# Patient Record
Sex: Male | Born: 1935 | Race: White | Hispanic: No | Marital: Married | Smoking: Former smoker
Health system: Southern US, Community
[De-identification: ages and names within clinical notes are randomized; demographics above are authoritative.]

## PROBLEM LIST (undated history)

## (undated) DIAGNOSIS — I10 Essential (primary) hypertension: Secondary | ICD-10-CM

## (undated) DIAGNOSIS — Z950 Presence of cardiac pacemaker: Secondary | ICD-10-CM

## (undated) DIAGNOSIS — K219 Gastro-esophageal reflux disease without esophagitis: Secondary | ICD-10-CM

## (undated) DIAGNOSIS — I471 Supraventricular tachycardia: Secondary | ICD-10-CM

## (undated) DIAGNOSIS — N189 Chronic kidney disease, unspecified: Secondary | ICD-10-CM

## (undated) DIAGNOSIS — I495 Sick sinus syndrome: Secondary | ICD-10-CM

## (undated) DIAGNOSIS — K5730 Diverticulosis of large intestine without perforation or abscess without bleeding: Secondary | ICD-10-CM

## (undated) DIAGNOSIS — F419 Anxiety disorder, unspecified: Secondary | ICD-10-CM

## (undated) DIAGNOSIS — G894 Chronic pain syndrome: Secondary | ICD-10-CM

## (undated) DIAGNOSIS — N186 End stage renal disease: Secondary | ICD-10-CM

## (undated) DIAGNOSIS — E782 Mixed hyperlipidemia: Secondary | ICD-10-CM

## (undated) DIAGNOSIS — Z5189 Encounter for other specified aftercare: Secondary | ICD-10-CM

## (undated) DIAGNOSIS — T82598A Other mechanical complication of other cardiac and vascular devices and implants, initial encounter: Secondary | ICD-10-CM

## (undated) DIAGNOSIS — N2581 Secondary hyperparathyroidism of renal origin: Secondary | ICD-10-CM

## (undated) DIAGNOSIS — N185 Chronic kidney disease, stage 5: Secondary | ICD-10-CM

## (undated) DIAGNOSIS — J189 Pneumonia, unspecified organism: Secondary | ICD-10-CM

## (undated) DIAGNOSIS — D649 Anemia, unspecified: Secondary | ICD-10-CM

## (undated) DIAGNOSIS — M109 Gout, unspecified: Secondary | ICD-10-CM

## (undated) DIAGNOSIS — K5792 Diverticulitis of intestine, part unspecified, without perforation or abscess without bleeding: Secondary | ICD-10-CM

## (undated) HISTORY — DX: End stage renal disease: N18.6

## (undated) HISTORY — DX: Diverticulosis of large intestine without perforation or abscess without bleeding: K57.30

## (undated) HISTORY — DX: Anemia, unspecified: D64.9

## (undated) HISTORY — DX: Secondary hyperparathyroidism of renal origin: N25.81

## (undated) HISTORY — DX: Chronic kidney disease, unspecified: N18.9

## (undated) HISTORY — DX: Gout, unspecified: M10.9

## (undated) HISTORY — DX: Mixed hyperlipidemia: E78.2

## (undated) HISTORY — DX: Presence of cardiac pacemaker: Z95.0

## (undated) HISTORY — DX: Chronic pain syndrome: G89.4

## (undated) HISTORY — PX: PROSTATE SURGERY: SHX751

## (undated) HISTORY — DX: Essential (primary) hypertension: I10

## (undated) HISTORY — DX: Chronic kidney disease, stage 5: N18.5

## (undated) HISTORY — PX: HERNIA REPAIR: SHX51

## (undated) HISTORY — PX: BUNIONECTOMY: SHX129

## (undated) HISTORY — DX: Sick sinus syndrome: I49.5

## (undated) HISTORY — DX: Supraventricular tachycardia: I47.1

## (undated) HISTORY — DX: Other mechanical complication of other cardiac and vascular devices and implants, initial encounter: T82.598A

---

## 2010-06-05 DIAGNOSIS — K5792 Diverticulitis of intestine, part unspecified, without perforation or abscess without bleeding: Secondary | ICD-10-CM

## 2010-06-05 DIAGNOSIS — Z5189 Encounter for other specified aftercare: Secondary | ICD-10-CM

## 2010-06-05 DIAGNOSIS — IMO0001 Reserved for inherently not codable concepts without codable children: Secondary | ICD-10-CM

## 2010-06-05 HISTORY — DX: Diverticulitis of intestine, part unspecified, without perforation or abscess without bleeding: K57.92

## 2010-06-05 HISTORY — DX: Reserved for inherently not codable concepts without codable children: IMO0001

## 2010-06-05 HISTORY — DX: Encounter for other specified aftercare: Z51.89

## 2010-07-06 HISTORY — PX: INSERT / REPLACE / REMOVE PACEMAKER: SUR710

## 2010-07-12 DIAGNOSIS — Z950 Presence of cardiac pacemaker: Secondary | ICD-10-CM

## 2010-07-12 HISTORY — DX: Presence of cardiac pacemaker: Z95.0

## 2010-09-04 DIAGNOSIS — J189 Pneumonia, unspecified organism: Secondary | ICD-10-CM

## 2010-09-04 HISTORY — DX: Pneumonia, unspecified organism: J18.9

## 2011-07-10 ENCOUNTER — Encounter: Payer: Self-pay | Admitting: Vascular Surgery

## 2011-07-12 ENCOUNTER — Encounter: Payer: Self-pay | Admitting: Vascular Surgery

## 2011-07-13 ENCOUNTER — Encounter (INDEPENDENT_AMBULATORY_CARE_PROVIDER_SITE_OTHER): Payer: Medicare Other | Admitting: *Deleted

## 2011-07-13 ENCOUNTER — Ambulatory Visit (INDEPENDENT_AMBULATORY_CARE_PROVIDER_SITE_OTHER): Payer: Medicare Other | Admitting: Vascular Surgery

## 2011-07-13 ENCOUNTER — Encounter: Payer: Self-pay | Admitting: Vascular Surgery

## 2011-07-13 VITALS — BP 137/84 | HR 83 | Resp 16 | Ht 69.0 in | Wt 173.0 lb

## 2011-07-13 DIAGNOSIS — N186 End stage renal disease: Secondary | ICD-10-CM

## 2011-07-13 DIAGNOSIS — Z48812 Encounter for surgical aftercare following surgery on the circulatory system: Secondary | ICD-10-CM

## 2011-07-13 DIAGNOSIS — N184 Chronic kidney disease, stage 4 (severe): Secondary | ICD-10-CM

## 2011-07-13 HISTORY — DX: End stage renal disease: N18.6

## 2011-07-13 NOTE — Progress Notes (Signed)
[facility] [place] HISTORY AND PHYSICAL   History of Present Illness:  Patient is a 78 y.o. year old male who presents for placement of a permanent hemodialysis access. The patient is right handed .  The patient is not currently on hemodialysis.  The cause of renal failure is thought to be secondary to nephrolithiasis.  Other chronic medical problems include cardiac arrhythmia, secondary hyperparathyroidism, anemia. All of these are currently stable.  Past Medical History  Diagnosis Date  . Anemia   . Secondary hyperparathyroidism   . Chronic kidney disease   . Pacemaker 07/12/10    History reviewed. No pertinent past surgical history.   Social History History  Substance Use Topics  . Smoking status: Former Research scientist (life sciences)  . Smokeless tobacco: Not on file  . Alcohol Use: No    Family History Family History  Problem Relation Age of Onset  . Heart disease Mother   . Hypertension Father   . Hypertension Daughter   . Hypertension Son     Allergies  No Known Allergies   Current Outpatient Prescriptions  Medication Sig Dispense Refill  . aspirin 325 MG tablet Take 325 mg by mouth daily.      . calcitRIOL (ROCALTROL) 0.5 MCG capsule Take 0.5 mcg by mouth every other day.      . furosemide (LASIX) 20 MG tablet Take 20 mg by mouth 2 (two) times daily.      Marland Kitchen LORazepam (ATIVAN) 1 MG tablet Take 1 mg by mouth every 6 (six) hours as needed.      . pantoprazole (PROTONIX) 40 MG tablet Take 40 mg by mouth daily.      . sodium bicarbonate 650 MG tablet Take 650 mg by mouth 2 (two) times daily.      . Cyanocobalamin (VITAMIN B-12 CR PO) Take by mouth daily.      . traMADol (ULTRAM) 50 MG tablet Take 50 mg by mouth every 6 (six) hours as needed.      . vitamin E 400 UNIT capsule Take 400 Units by mouth daily.        ROS:   General:  No weight loss, Fever, chills  HEENT: No recent headaches, no nasal bleeding, no visual changes, no sore throat  Neurologic: No  dizziness, blackouts, seizures. No recent symptoms of stroke or mini- stroke. No recent episodes of slurred speech, or temporary blindness.  Cardiac: No recent episodes of chest pain/pressure, no shortness of breath at rest.  No shortness of breath with exertion.  Denies history of atrial fibrillation or irregular heartbeat  Vascular: No history of rest pain in feet.  No history of claudication.  No history of non-healing ulcer, No history of DVT   Pulmonary: No home oxygen, no productive cough, no hemoptysis,  No asthma or wheezing  Musculoskeletal:  [ ]  Arthritis, [ ]  Low back pain,  [ ]  Joint pain  Hematologic:No history of hypercoagulable state.  No history of easy bleeding.  No history of anemia  Gastrointestinal: No hematochezia or melena,  No gastroesophageal reflux, no trouble swallowing  Urinary: [ ]  chronic Kidney disease, [ ]  on HD - [ ]  MWF or [ ]  TTHS, [ ]  Burning with urination, [ ]  Frequent urination, [ ]  Difficulty urinating;   Skin: No rashes  Psychological: No history of anxiety,  No history of depression   Physical Examination  Filed Vitals:   07/13/11 1145  BP: 137/84  Pulse: 83  Resp: 16  Height: 5\' 9"  (1.753  m)  Weight: 173 lb (78.472 kg)  SpO2: 98%    Body mass index is 25.55 kg/(m^2).  General:  Alert and oriented, no acute distress HEENT: Normal Neck: No bruit or JVD Pulmonary: Clear to auscultation bilaterally Cardiac: Regular Rate and Rhythm without murmur, left side pacemaker Gastrointestinal: Soft, non-tender, non-distended, no mass, no scars Skin: No rash Extremity Pulses:  2+ radial, brachial pulses bilaterally, prominent cephalic vein from the wrist all the way to the upper arm in the right upper extremity Musculoskeletal: No deformity or edema  Neurologic: Upper and lower extremity motor 5/5 and symmetric  DATA: Vein mapping ultrasound today shows the cephalic vein in the right side between 30-40 mm in the forearm and 50-60 mm in the  upper arm the basilic vein is small in the right arm the basilic vein is small in the left arm cephalic vein is small in the left arm  ASSESSMENT:   Needs long-term hemodialysis access. His anatomy is suitable for placement of a right radiocephalic AV fistula.   PLAN:  Risks benefits possible complications and procedure details of placing a AV fistula were described to the patient and his family today. Risks and benefits including but limited to bleeding infection non-maturation of the fistula ischemic steal. They wish to proceed his fistula is scheduled for Monday, 07/17/2011.  Ruta Hinds, MD [facility] [place] Office: [phone] Pager: [phone]

## 2011-07-14 ENCOUNTER — Encounter (HOSPITAL_COMMUNITY): Payer: Self-pay | Admitting: Pharmacy Technician

## 2011-07-14 ENCOUNTER — Encounter (HOSPITAL_COMMUNITY): Payer: Self-pay | Admitting: *Deleted

## 2011-07-14 ENCOUNTER — Encounter: Payer: Self-pay | Admitting: Vascular Surgery

## 2011-07-14 ENCOUNTER — Other Ambulatory Visit: Payer: Self-pay | Admitting: *Deleted

## 2011-07-14 NOTE — Consult Note (Signed)
Anesthesia:  Patient is a 78 year old male scheduled for a right UE AVF on 2//11/13.  He will be a same day work-up.  He is not yet on HD.  History includes CKD, former smoker, anemia, secondary hyperparathyroidism, GERD, anxiety, and history includes tachybrady syndrome s/p dual chamber PPM ([employer]) on 07/12/10 by Dr. Agustin Cree [facility]).   Short Stay learned of this posting around 1130 today.  His Cardiologist's phone lines were busy, but we were able to send a request for records and completion of the cardiac peri-operative device form via fax.  Unfortunately, that office closes at 1230 and no records were received.  We were, however, able to receive records from [facility] including a recent EKG, CXR, echo, and PPM Op note.    Echo on 06/29/10 showed Preserved LV EF at 55%, trace pulmonary insufficiency, trace MR, mild TR, mild aortic insufficiency.  Relaxation abnormalities.    EKG from 09/14/10 showed a-paced rhythm.  CXR from that same day showed no acute abnormality.  He will be for labs on the day of surgery.  He will be evaluated by his Anesthesiologist on the day of surgery, but anticipate he can proceed if labs reasonable and no acute CV symptoms.

## 2011-07-14 NOTE — Procedures (Unsigned)
CEPHALIC VEIN MAPPING  INDICATION:  Preoperative vein mapping for AVF placement.  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  Pacemaker placement.  EXAM:  The right cephalic vein is compressible with diameter measurements ranging from 0.61 to 0.31 cm.  The right basilic vein is compressible with diameter measurements ranging from 0.32 to 0.15 cm.  The left cephalic vein is compressible with diameter measurements ranging from 0.31 to 0.15 cm.  The left basilic vein is compressible with diameter measurements ranging from 0.46 to 0.24 cm.  IMPRESSION:  Patent right and left cephalic and basilic veins with diameter measurements as described above.  ___________________________________________ Jessy Oto. Fields, MD  EM/MEDQ  D:  07/13/2011  T:  07/13/2011  Job:  [id number]

## 2011-07-16 MED ORDER — DEXTROSE 5 % IV SOLN
1.5000 g | INTRAVENOUS | Status: AC
  Administered 2011-07-17: 1.5 g via INTRAVENOUS
  Filled 2011-07-16: qty 1.5

## 2011-07-17 ENCOUNTER — Encounter (HOSPITAL_COMMUNITY)
Admission: RE | Disposition: A | Discharge status: Home / Self Care | Payer: Self-pay | Source: Ambulatory Visit | Attending: Vascular Surgery

## 2011-07-17 ENCOUNTER — Ambulatory Visit (HOSPITAL_COMMUNITY)
Admission: RE | Admit: 2011-07-17 | Discharge: 2011-07-17 | Disposition: A | Discharge status: Home / Self Care | Payer: Medicare Other | Source: Ambulatory Visit | Attending: Vascular Surgery | Admitting: Vascular Surgery

## 2011-07-17 ENCOUNTER — Ambulatory Visit (HOSPITAL_COMMUNITY): Payer: Medicare Other | Admitting: Vascular Surgery

## 2011-07-17 ENCOUNTER — Encounter (HOSPITAL_COMMUNITY): Payer: Self-pay | Admitting: *Deleted

## 2011-07-17 ENCOUNTER — Encounter (HOSPITAL_COMMUNITY): Payer: Self-pay | Admitting: Vascular Surgery

## 2011-07-17 DIAGNOSIS — Z950 Presence of cardiac pacemaker: Secondary | ICD-10-CM | POA: Insufficient documentation

## 2011-07-17 DIAGNOSIS — N186 End stage renal disease: Secondary | ICD-10-CM

## 2011-07-17 HISTORY — DX: Anxiety disorder, unspecified: F41.9

## 2011-07-17 HISTORY — PX: AV FISTULA PLACEMENT: SHX1204

## 2011-07-17 HISTORY — DX: Gastro-esophageal reflux disease without esophagitis: K21.9

## 2011-07-17 LAB — SURGICAL PCR SCREEN
MRSA, PCR: NEGATIVE
Staphylococcus aureus: NEGATIVE

## 2011-07-17 SURGERY — ARTERIOVENOUS (AV) FISTULA CREATION
Anesthesia: Monitor Anesthesia Care | Site: Arm Lower | Laterality: Right | Wound class: Clean

## 2011-07-17 MED ORDER — MIDAZOLAM HCL 5 MG/5ML IJ SOLN
INTRAMUSCULAR | Status: DC | PRN
  Administered 2011-07-17: 1 mg via INTRAVENOUS

## 2011-07-17 MED ORDER — HYDROMORPHONE HCL PF 1 MG/ML IJ SOLN: 0.2500 mg | INTRAMUSCULAR | Status: DC | PRN

## 2011-07-17 MED ORDER — SODIUM CHLORIDE 0.9 % IV SOLN: INTRAVENOUS | Status: DC

## 2011-07-17 MED ORDER — HEPARIN SODIUM (PORCINE) 1000 UNIT/ML IJ SOLN
INTRAMUSCULAR | Status: DC | PRN
  Administered 2011-07-17: 5000 [IU] via INTRAVENOUS

## 2011-07-17 MED ORDER — MUPIROCIN 2 % EX OINT
TOPICAL_OINTMENT | CUTANEOUS | Status: AC
  Filled 2011-07-17: qty 22

## 2011-07-17 MED ORDER — SODIUM CHLORIDE 0.9 % IV SOLN
INTRAVENOUS | Status: DC | PRN
  Administered 2011-07-17: 07:00:00 via INTRAVENOUS

## 2011-07-17 MED ORDER — LIDOCAINE HCL (PF) 1 % IJ SOLN
INTRAMUSCULAR | Status: DC | PRN
  Administered 2011-07-17: 8 mL

## 2011-07-17 MED ORDER — MUPIROCIN 2 % EX OINT
TOPICAL_OINTMENT | Freq: Once | CUTANEOUS | Status: DC
Start: 2011-07-17 — End: 2011-07-17

## 2011-07-17 MED ORDER — PROMETHAZINE HCL 25 MG/ML IJ SOLN: 6.2500 mg | INTRAMUSCULAR | Status: DC | PRN

## 2011-07-17 MED ORDER — FENTANYL CITRATE 0.05 MG/ML IJ SOLN
INTRAMUSCULAR | Status: DC | PRN
  Administered 2011-07-17: 50 ug via INTRAVENOUS

## 2011-07-17 MED ORDER — PROTAMINE SULFATE 10 MG/ML IV SOLN
INTRAVENOUS | Status: DC | PRN
  Administered 2011-07-17: 50 mg via INTRAVENOUS

## 2011-07-17 MED ORDER — SODIUM CHLORIDE 0.9 % IR SOLN
Status: DC | PRN
  Administered 2011-07-17: 09:00:00

## 2011-07-17 MED ORDER — PHENYLEPHRINE HCL 10 MG/ML IJ SOLN
INTRAMUSCULAR | Status: DC | PRN
  Administered 2011-07-17: 80 ug via INTRAVENOUS

## 2011-07-17 MED ORDER — MORPHINE SULFATE 4 MG/ML IJ SOLN: 0.0500 mg/kg | INTRAMUSCULAR | Status: DC | PRN

## 2011-07-17 MED ORDER — 0.9 % SODIUM CHLORIDE (POUR BTL) OPTIME
TOPICAL | Status: DC | PRN
  Administered 2011-07-17: 200 mL

## 2011-07-17 MED ORDER — MEPERIDINE HCL 25 MG/ML IJ SOLN: 6.2500 mg | INTRAMUSCULAR | Status: DC | PRN

## 2011-07-17 MED ORDER — PROPOFOL 10 MG/ML IV EMUL
INTRAVENOUS | Status: DC | PRN
  Administered 2011-07-17: 50 ug/kg/min via INTRAVENOUS

## 2011-07-17 MED ORDER — OXYCODONE HCL 5 MG PO TABS: 5.0000 mg | ORAL_TABLET | ORAL | Status: AC | PRN

## 2011-07-17 SURGICAL SUPPLY — 38 items
CANISTER SUCTION 2500CC (MISCELLANEOUS) ×2 IMPLANT
CLIP TI MEDIUM 6 (CLIP) ×2 IMPLANT
CLIP TI WIDE RED SMALL 6 (CLIP) ×2 IMPLANT
CLOTH BEACON ORANGE TIMEOUT ST (SAFETY) ×2 IMPLANT
COVER PROBE W GEL 5X96 (DRAPES) ×2 IMPLANT
COVER SURGICAL LIGHT HANDLE (MISCELLANEOUS) ×4 IMPLANT
DECANTER SPIKE VIAL GLASS SM (MISCELLANEOUS) ×2 IMPLANT
DERMABOND ADHESIVE PROPEN (GAUZE/BANDAGES/DRESSINGS) ×1
DERMABOND ADVANCED (GAUZE/BANDAGES/DRESSINGS) ×1
DERMABOND ADVANCED .7 DNX12 (GAUZE/BANDAGES/DRESSINGS) ×1 IMPLANT
DERMABOND ADVANCED .7 DNX6 (GAUZE/BANDAGES/DRESSINGS) ×1 IMPLANT
DRAIN PENROSE 1/4X12 LTX STRL (WOUND CARE) ×2 IMPLANT
ELECT REM PT RETURN 9FT ADLT (ELECTROSURGICAL) ×2
ELECTRODE REM PT RTRN 9FT ADLT (ELECTROSURGICAL) ×1 IMPLANT
GAUZE SPONGE 2X2 8PLY STRL LF (GAUZE/BANDAGES/DRESSINGS) ×1 IMPLANT
GEL ULTRASOUND 20GR AQUASONIC (MISCELLANEOUS) IMPLANT
GLOVE BIO SURGEON STRL SZ 6.5 (GLOVE) ×2 IMPLANT
GLOVE BIO SURGEON STRL SZ7.5 (GLOVE) ×2 IMPLANT
GLOVE BIOGEL PI IND STRL 7.0 (GLOVE) ×1 IMPLANT
GLOVE BIOGEL PI INDICATOR 7.0 (GLOVE) ×1
GOWN PREVENTION PLUS XLARGE (GOWN DISPOSABLE) ×2 IMPLANT
GOWN STRL NON-REIN LRG LVL3 (GOWN DISPOSABLE) ×4 IMPLANT
KIT BASIN OR (CUSTOM PROCEDURE TRAY) ×2 IMPLANT
KIT ROOM TURNOVER OR (KITS) ×2 IMPLANT
LOOP VESSEL MINI RED (MISCELLANEOUS) ×2 IMPLANT
NS IRRIG 1000ML POUR BTL (IV SOLUTION) ×2 IMPLANT
PACK CV ACCESS (CUSTOM PROCEDURE TRAY) ×2 IMPLANT
PAD ARMBOARD 7.5X6 YLW CONV (MISCELLANEOUS) ×4 IMPLANT
SPONGE GAUZE 2X2 STER 10/PKG (GAUZE/BANDAGES/DRESSINGS) ×1
SPONGE SURGIFOAM ABS GEL 100 (HEMOSTASIS) IMPLANT
SUT PROLENE 7 0 BV 1 (SUTURE) ×2 IMPLANT
SUT VIC AB 3-0 SH 27 (SUTURE) ×1
SUT VIC AB 3-0 SH 27X BRD (SUTURE) ×1 IMPLANT
SUT VICRYL 4-0 PS2 18IN ABS (SUTURE) ×2 IMPLANT
TOWEL OR 17X24 6PK STRL BLUE (TOWEL DISPOSABLE) ×2 IMPLANT
TOWEL OR 17X26 10 PK STRL BLUE (TOWEL DISPOSABLE) ×2 IMPLANT
UNDERPAD 30X30 INCONTINENT (UNDERPADS AND DIAPERS) ×2 IMPLANT
WATER STERILE IRR 1000ML POUR (IV SOLUTION) ×2 IMPLANT

## 2011-07-17 NOTE — Transfer of Care (Signed)
Immediate Anesthesia Transfer of Care Note  Patient: Timothy Carney  Procedure(s) Performed:  ARTERIOVENOUS (AV) FISTULA CREATION  Patient Location: PACU  Anesthesia Type: MAC  Level of Consciousness: awake, alert  and oriented  Airway & Oxygen Therapy: Patient Spontanous Breathing and Patient connected to nasal cannula oxygen  Post-op Assessment: Report given to PACU RN and Post -op Vital signs reviewed and stable  Post vital signs: Reviewed  Complications: No apparent anesthesia complications

## 2011-07-17 NOTE — Preoperative (Signed)
Beta Blockers   Reason not to administer Beta Blockers:Not Applicable. No home beta blockers 

## 2011-07-17 NOTE — Interval H&P Note (Signed)
History and Physical Interval Note:  07/17/2011 7:37 AM  Timothy Carney  has presented today for surgery, with the diagnosis of ESRD  The various methods of treatment have been discussed with the patient and family. After consideration of risks, benefits and other options for treatment, the patient has consented to  Procedure(s): ARTERIOVENOUS (AV) FISTULA CREATION as a surgical intervention .  The patients' history has been reviewed, patient examined, no change in status, stable for surgery.  I have reviewed the patients' chart and labs.  Questions were answered to the patient's satisfaction.     Ondrea Dow E

## 2011-07-17 NOTE — H&P (View-Only) (Signed)
[facility] [place] HISTORY AND PHYSICAL   History of Present Illness:  Patient is a 78 y.o. year old male who presents for placement of a permanent hemodialysis access. The patient is right handed .  The patient is not currently on hemodialysis.  The cause of renal failure is thought to be secondary to nephrolithiasis.  Other chronic medical problems include cardiac arrhythmia, secondary hyperparathyroidism, anemia. All of these are currently stable.  Past Medical History  Diagnosis Date  . Anemia   . Secondary hyperparathyroidism   . Chronic kidney disease   . Pacemaker 07/12/10    History reviewed. No pertinent past surgical history.   Social History History  Substance Use Topics  . Smoking status: Former Research scientist (life sciences)  . Smokeless tobacco: Not on file  . Alcohol Use: No    Family History Family History  Problem Relation Age of Onset  . Heart disease Mother   . Hypertension Father   . Hypertension Daughter   . Hypertension Son     Allergies  No Known Allergies   Current Outpatient Prescriptions  Medication Sig Dispense Refill  . aspirin 325 MG tablet Take 325 mg by mouth daily.      . calcitRIOL (ROCALTROL) 0.5 MCG capsule Take 0.5 mcg by mouth every other day.      . furosemide (LASIX) 20 MG tablet Take 20 mg by mouth 2 (two) times daily.      Marland Kitchen LORazepam (ATIVAN) 1 MG tablet Take 1 mg by mouth every 6 (six) hours as needed.      . pantoprazole (PROTONIX) 40 MG tablet Take 40 mg by mouth daily.      . sodium bicarbonate 650 MG tablet Take 650 mg by mouth 2 (two) times daily.      . Cyanocobalamin (VITAMIN B-12 CR PO) Take by mouth daily.      . traMADol (ULTRAM) 50 MG tablet Take 50 mg by mouth every 6 (six) hours as needed.      . vitamin E 400 UNIT capsule Take 400 Units by mouth daily.        ROS:   General:  No weight loss, Fever, chills  HEENT: No recent headaches, no nasal bleeding, no visual changes, no sore throat  Neurologic: No  dizziness, blackouts, seizures. No recent symptoms of stroke or mini- stroke. No recent episodes of slurred speech, or temporary blindness.  Cardiac: No recent episodes of chest pain/pressure, no shortness of breath at rest.  No shortness of breath with exertion.  Denies history of atrial fibrillation or irregular heartbeat  Vascular: No history of rest pain in feet.  No history of claudication.  No history of non-healing ulcer, No history of DVT   Pulmonary: No home oxygen, no productive cough, no hemoptysis,  No asthma or wheezing  Musculoskeletal:  [ ]  Arthritis, [ ]  Low back pain,  [ ]  Joint pain  Hematologic:No history of hypercoagulable state.  No history of easy bleeding.  No history of anemia  Gastrointestinal: No hematochezia or melena,  No gastroesophageal reflux, no trouble swallowing  Urinary: [ ]  chronic Kidney disease, [ ]  on HD - [ ]  MWF or [ ]  TTHS, [ ]  Burning with urination, [ ]  Frequent urination, [ ]  Difficulty urinating;   Skin: No rashes  Psychological: No history of anxiety,  No history of depression   Physical Examination  Filed Vitals:   07/13/11 1145  BP: 137/84  Pulse: 83  Resp: 16  Height: 5\' 9"  (1.753  m)  Weight: 173 lb (78.472 kg)  SpO2: 98%    Body mass index is 25.55 kg/(m^2).  General:  Alert and oriented, no acute distress HEENT: Normal Neck: No bruit or JVD Pulmonary: Clear to auscultation bilaterally Cardiac: Regular Rate and Rhythm without murmur, left side pacemaker Gastrointestinal: Soft, non-tender, non-distended, no mass, no scars Skin: No rash Extremity Pulses:  2+ radial, brachial pulses bilaterally, prominent cephalic vein from the wrist all the way to the upper arm in the right upper extremity Musculoskeletal: No deformity or edema  Neurologic: Upper and lower extremity motor 5/5 and symmetric  DATA: Vein mapping ultrasound today shows the cephalic vein in the right side between 30-40 mm in the forearm and 50-60 mm in the  upper arm the basilic vein is small in the right arm the basilic vein is small in the left arm cephalic vein is small in the left arm  ASSESSMENT:   Needs long-term hemodialysis access. His anatomy is suitable for placement of a right radiocephalic AV fistula.   PLAN:  Risks benefits possible complications and procedure details of placing a AV fistula were described to the patient and his family today. Risks and benefits including but limited to bleeding infection non-maturation of the fistula ischemic steal. They wish to proceed his fistula is scheduled for Monday, 07/17/2011.  Ruta Hinds, MD [facility] [place] Office: [phone] Pager: [phone]

## 2011-07-17 NOTE — Op Note (Signed)
Procedure: Right Radial Cephalic AV fistula  Preop: ESRD  Postop: ESRD  Anesthesia: MAC with local  Assistant:Regina Roczniak, PA-c  Findings: 3 mm cephalic vein      2.5  mm radial artery with some calcification   Procedure Details: The rightt upper extremity was prepped and draped in usual sterile fashion. Local anesthesia was infiltrated midway between the cephalic and radial artery anatomically.  The vein was not palpable due to the patients obesity.  A longitudinal skin incision was then made in this location at the distal right forearm.  The incision was carried into the subcutaneous tissues down to level cephalic vein. The vein had some spasm but was overall reasonable quality accepting a 3 mm dilator.  The vein was dissected free circumferentially and small side branches ligated and divided between silk ties. The distal end was ligated and the vein probed and found to accept up to a 3 mm dilator.  This was gently distended with heparinized saline, spatulated,  and marked for orientation.  Next the radial artery was dissected free in the medial portion incision. The artery was  2.5 mm in diameter with some calcification but a reasonable pulse. The vessel loops were placed proximal and distal to the planned site of arteriotomy. The patient was given 5000 units of intravenous heparin. After appropriate circulation time, the vessel loops were used to control the artery. A longitudinal opening was made in the rightt radial artery. The vein was controlled proximally with a fine bulldog clamp. The vein was then swung over to the artery and sewn end of vein to side of artery using a running 7-0 Prolene suture. Just prior to completion, the anastomosis was fore bled back bled and thoroughly flushed. The anastomosis was secured, vessel loops released, and there was a palpable thrill in the fistula immediately. The patient was given 50 mg of Protamine for heparin reversal.   After hemostasis was  obtained, the subcutaneous tissues were reapproximated using a running 3-0 Vicryl suture. The skin was then closed with a 4 Vicryl subcuticular stitch. Dermabond was applied to the skin incision.    Ruta Hinds, MD [facility] [place] Office: [phone] Pager: [phone]

## 2011-07-17 NOTE — Progress Notes (Signed)
[place] with Dr Ola Spurr this am regarding....dual chamber pacer......Marland Kitchenwe were unable to get pacer form sent to cardio....their office closed at 12:30 on Friday.....Marland Kitchensurgery is below the umbilicus..Dr Ola Spurr said it would  "ok"...

## 2011-07-17 NOTE — Anesthesia Preprocedure Evaluation (Addendum)
Anesthesia Evaluation  Patient identified by MRN, date of birth, ID band Patient awake    Reviewed: Allergy & Precautions, H&P , NPO status , Patient's Chart, lab work & pertinent test results, reviewed documented beta blocker date and time   History of Anesthesia Complications Negative for: history of anesthetic complications  Airway Mallampati: II      Dental  (+) Teeth Intact and Dental Advisory Given   Pulmonary neg pulmonary ROS,  clear to auscultation        Cardiovascular + pacemaker Regular Normal    Neuro/Psych Anxiety Negative Neurological ROS     GI/Hepatic Neg liver ROS, GERD-  Medicated and Controlled,  Endo/Other  Negative Endocrine ROS  Renal/GU CRFRenal disease  Genitourinary negative   Musculoskeletal   Abdominal   Peds  Hematology negative hematology ROS (+)   Anesthesia Other Findings   Reproductive/Obstetrics                          Anesthesia Physical Anesthesia Plan  ASA: III  Anesthesia Plan: MAC   Post-op Pain Management:    Induction:   Airway Management Planned: Nasal Cannula  Additional Equipment:   Intra-op Plan:   Post-operative Plan:   Informed Consent: I have reviewed the patients History and Physical, chart, labs and discussed the procedure including the risks, benefits and alternatives for the proposed anesthesia with the patient or authorized representative who has indicated his/her understanding and acceptance.   Dental advisory given  Plan Discussed with: Anesthesiologist  Anesthesia Plan Comments:         Anesthesia Quick Evaluation

## 2011-07-17 NOTE — Anesthesia Postprocedure Evaluation (Signed)
  Anesthesia Post-op Note  Patient: Timothy Carney  Procedure(s) Performed:  ARTERIOVENOUS (AV) FISTULA CREATION  Patient Location: PACU  Anesthesia Type: MAC  Level of Consciousness: awake  Airway and Oxygen Therapy: Patient Spontanous Breathing  Post-op Pain: mild  Post-op Assessment: Post-op Vital signs reviewed  Post-op Vital Signs: stable  Complications: No apparent anesthesia complications

## 2011-07-18 ENCOUNTER — Encounter (HOSPITAL_COMMUNITY): Payer: Self-pay | Admitting: Vascular Surgery

## 2011-07-18 MED FILL — Mupirocin Oint 2%: CUTANEOUS | Qty: 22 | Status: AC

## 2011-07-19 LAB — POCT I-STAT 4, (NA,K, GLUC, HGB,HCT)
Glucose, Bld: 112 mg/dL — ABNORMAL HIGH (ref 70–99)
HCT: 37 % — ABNORMAL LOW (ref 39.0–52.0)
Potassium: 3.6 mEq/L (ref 3.5–5.1)
Sodium: 142 mEq/L (ref 135–145)

## 2011-07-21 ENCOUNTER — Other Ambulatory Visit: Payer: Medicare Other

## 2011-07-21 ENCOUNTER — Ambulatory Visit: Payer: Medicare Other | Admitting: Vascular Surgery

## 2011-08-23 ENCOUNTER — Encounter: Payer: Self-pay | Admitting: Vascular Surgery

## 2011-08-24 ENCOUNTER — Encounter: Payer: Self-pay | Admitting: Vascular Surgery

## 2011-08-24 ENCOUNTER — Ambulatory Visit (INDEPENDENT_AMBULATORY_CARE_PROVIDER_SITE_OTHER): Payer: Medicare Other | Admitting: Vascular Surgery

## 2011-08-24 ENCOUNTER — Ambulatory Visit: Payer: Medicare Other | Admitting: Vascular Surgery

## 2011-08-24 VITALS — BP 150/76 | HR 78 | Temp 98.1°F | Ht 69.0 in | Wt 176.2 lb

## 2011-08-24 DIAGNOSIS — N186 End stage renal disease: Secondary | ICD-10-CM

## 2011-08-24 NOTE — Progress Notes (Signed)
Addended by: Mena Goes on: 08/24/2011 11:42 AM   Modules accepted: Orders

## 2011-08-24 NOTE — Progress Notes (Signed)
[facility] [place] HISTORY AND PHYSICAL    History of Present Illness:  Patient is a 78 y.o. year old male who presents for post-operative follow-up after placement of a right radiocephalic AV fistula. This was placed 07/17/2011. The patient is currently not on dialysis. He denies any numbness or tingling in the hand. He is starting to squeeze a ball to develop the fistula.   Physical Examination  Filed Vitals:   08/24/11 1015  BP: 150/76  Pulse: 78  Temp: 98.1 F (36.7 C)    Body mass index is 26.02 kg/(m^2).  General:  Alert and oriented, no acute distress Skin: No rash Extremities:  Healed incision right wrist, palpable cephalic vein with easily palpable thrill up to mid forearm the fistula is not as developed more distally Neurologic: Upper and lower extremity motor 5/5 and symmetric    ASSESSMENT: Developing fistula but not ready for cannulation at this point   PLAN:  He will followup in one month with a duplex of his AV fistula for depth in diameter. If the fistula has not fully developed by his next followup and we will consider revision or other intervention.   Ruta Hinds, MD [facility] [place] Office: [phone] Pager: [phone]

## 2011-09-19 ENCOUNTER — Encounter: Payer: Self-pay | Admitting: Vascular Surgery

## 2011-09-21 ENCOUNTER — Encounter: Payer: Self-pay | Admitting: Vascular Surgery

## 2011-09-21 ENCOUNTER — Encounter (INDEPENDENT_AMBULATORY_CARE_PROVIDER_SITE_OTHER): Payer: Medicare Other | Admitting: *Deleted

## 2011-09-21 ENCOUNTER — Ambulatory Visit (INDEPENDENT_AMBULATORY_CARE_PROVIDER_SITE_OTHER): Payer: Medicare Other | Admitting: Vascular Surgery

## 2011-09-21 VITALS — BP 143/89 | HR 75 | Temp 97.8°F | Ht 69.0 in | Wt 174.0 lb

## 2011-09-21 DIAGNOSIS — Z48812 Encounter for surgical aftercare following surgery on the circulatory system: Secondary | ICD-10-CM

## 2011-09-21 DIAGNOSIS — T82598A Other mechanical complication of other cardiac and vascular devices and implants, initial encounter: Secondary | ICD-10-CM

## 2011-09-21 DIAGNOSIS — N186 End stage renal disease: Secondary | ICD-10-CM

## 2011-09-21 DIAGNOSIS — N184 Chronic kidney disease, stage 4 (severe): Secondary | ICD-10-CM

## 2011-09-21 HISTORY — DX: Other mechanical complication of other cardiac and vascular devices and implants, initial encounter: T82.598A

## 2011-09-21 NOTE — Progress Notes (Signed)
Patient had a right radiocephalic AV fistula placed 07/17/2011. He returns today for further followup. He denies any symptoms of numbness or tingling in the hand. He is currently not on dialysis. However recent note from Dr. Moshe Cipro suggested that he may need this soon.  Physical exam: Filed Vitals:   09/21/11 1451  BP: 143/89  Pulse: 75  Temp: 97.8 F (36.6 C)  TempSrc: Oral  Height: 5\' 9"  (1.753 m)  Weight: 174 lb (78.926 kg)  SpO2: 99%    Right upper extremity: Well-healed wrist incision, fistula has a palpable thrill but seems to have diminished flow distally. Duplex ultrasound today shows narrowing of the proximal few centimeters of the fistula.  Assessment: Proximal narrowing of right radiocephalic AV fistula.  Plan: Will revise in the operating room on 10/04/2011.  Procedure risks benefits possible complications discussed the patient and his family today.  Ruta Hinds, MD [facility] [place] Office: [phone] Pager: [phone]

## 2011-09-22 ENCOUNTER — Other Ambulatory Visit: Payer: Self-pay

## 2011-09-22 ENCOUNTER — Encounter (HOSPITAL_COMMUNITY): Payer: Self-pay | Admitting: Pharmacy Technician

## 2011-09-28 ENCOUNTER — Encounter (HOSPITAL_COMMUNITY)
Admission: RE | Admit: 2011-09-28 | Discharge: 2011-09-28 | Disposition: A | Discharge status: Home / Self Care | Payer: Medicare Other | Source: Ambulatory Visit | Attending: Vascular Surgery | Admitting: Vascular Surgery

## 2011-09-28 ENCOUNTER — Encounter (HOSPITAL_COMMUNITY): Payer: Self-pay

## 2011-09-28 ENCOUNTER — Encounter (HOSPITAL_COMMUNITY)
Admission: RE | Admit: 2011-09-28 | Discharge: 2011-09-28 | Disposition: A | Discharge status: Home / Self Care | Payer: Medicare Other | Source: Ambulatory Visit | Attending: Anesthesiology | Admitting: Anesthesiology

## 2011-09-28 ENCOUNTER — Other Ambulatory Visit: Payer: Self-pay

## 2011-09-28 HISTORY — DX: Encounter for other specified aftercare: Z51.89

## 2011-09-28 HISTORY — DX: Diverticulitis of intestine, part unspecified, without perforation or abscess without bleeding: K57.92

## 2011-09-28 HISTORY — DX: Pneumonia, unspecified organism: J18.9

## 2011-09-28 HISTORY — DX: Essential (primary) hypertension: I10

## 2011-09-28 NOTE — Pre-Procedure Instructions (Signed)
[place]  09/28/2011   Your procedure is scheduled on:  Wednesday Oct 04, 2011.  Report to [facility] at 0730 AM.  Call this number if you have problems the morning of surgery: [phone]   Remember:   Do not eat food:After Midnight.  May have clear liquids: up to 4 Hours before arrival until 0330 am.  Clear liquids include soda, tea, black coffee, apple or grape juice, broth.  Take these medicines the morning of surgery with A SIP OF WATER: Lorazepam (Ativan), Metoprolol (Toprol XL), Pantoprazole (Protonix), and Tramadol (Ultram).   Do not wear jewelry, make-up or nail polish.  Do not wear lotions, powders, or perfumes. You may wear deodorant.  Do not shave 48 hours prior to surgery.  Do not bring valuables to the hospital.  Contacts, dentures or bridgework may not be worn into surgery.  Leave suitcase in the car. After surgery it may be brought to your room.  For patients admitted to the hospital, checkout time is 11:00 AM the day of discharge.   Patients discharged the day of surgery will not be allowed to drive home.  Name and phone number of your driver:   Special Instructions: CHG Shower Use Special Wash: 1/2 bottle night before surgery and 1/2 bottle morning of surgery.   Please read over the following fact sheets that you were given: Pain Booklet, Coughing and Deep Breathing, MRSA Information and Surgical Site Infection Prevention

## 2011-09-28 NOTE — Progress Notes (Signed)
Pt informed nurse that his right arm had a fistula and not left arm. Nurse called Dr. Oneida Alar office for clarification. Judy instructed nurse that pt was correct and that pts right arm was to be operated on on Oct 04, 2011 and not left as consent had stated. Consent changed. Plan of care updated with pt. Pt signed consent and consent was placed on chart.

## 2011-09-28 NOTE — Progress Notes (Signed)
Pt confirmed to having a stress test years ago but was unable to inform nurse as to where it occurred "somewhere in [facility]". Pt denied having a cardiac cath or sleep study.Records were requested from PCP at [facility] Dr. Garlon Hatchet at [phone], [facility] at [phone], and from [facility] Dr. Agustin Cree at [phone]. Perioperative prescription for implanted cardiac device programming form was also faxed to Dr. Wendy Poet office at [phone].

## 2011-09-29 NOTE — Procedures (Unsigned)
VASCULAR LAB EXAM  INDICATION:  Follow up right arm access placement.  HISTORY: Chronic kidney disease stage 4-5, right radiocephalic AV fistula placement 07/17/2011. Diabetes: Cardiac: Hypertension:  EXAM:  Patent right radiocephalic AV fistula with an area of narrowing and possibly thickened walls of the outflow vein near the anastomosis, with velocities of 711 cm/s.  IMPRESSION:  Patent right radiocephalic arteriovenous fistula with depth, diameter, branch, and velocity measurements shown on the following worksheet.  ___________________________________________ Jessy Oto. Fields, MD  EM/MEDQ  D:  09/22/2011  T:  09/22/2011  Job:  [id number]

## 2011-09-29 NOTE — Progress Notes (Signed)
Dr. Wendy Poet office called and they confirmed that they had received by fax the implanted cardiac device programming sheet. Nurse requested to receptionist that Physician fill out form and fax it back ASAP so that [employer] could be contacted before upcoming surgery. Receptionist verbalized understanding.

## 2011-10-03 MED ORDER — CEFAZOLIN SODIUM 1-5 GM-% IV SOLN
1.0000 g | Freq: Once | INTRAVENOUS | Status: AC
  Administered 2011-10-04: 1 g via INTRAVENOUS
  Filled 2011-10-03: qty 50

## 2011-10-03 NOTE — Progress Notes (Signed)
Dr. Agustin Cree called again and Nurse spoke with Timothy Carney and explained to her that the pacemaker sheet would need to be completed and faxed ASAP since pts surgery is scheduled for tomorrow. She verbalized understanding and stated "I passed it on to the physician and he signed off on it....Marland KitchenMarland KitchenI will let his nurse know about it."

## 2011-10-04 ENCOUNTER — Encounter (HOSPITAL_COMMUNITY): Payer: Self-pay

## 2011-10-04 ENCOUNTER — Encounter (HOSPITAL_COMMUNITY): Payer: Self-pay | Admitting: *Deleted

## 2011-10-04 ENCOUNTER — Ambulatory Visit (HOSPITAL_COMMUNITY)
Admission: RE | Admit: 2011-10-04 | Discharge: 2011-10-04 | Disposition: A | Discharge status: Home / Self Care | Payer: Medicare Other | Source: Ambulatory Visit | Attending: Vascular Surgery | Admitting: Vascular Surgery

## 2011-10-04 ENCOUNTER — Telehealth: Payer: Self-pay | Admitting: Vascular Surgery

## 2011-10-04 ENCOUNTER — Other Ambulatory Visit: Payer: Self-pay | Admitting: *Deleted

## 2011-10-04 ENCOUNTER — Ambulatory Visit (HOSPITAL_COMMUNITY): Payer: Medicare Other | Admitting: *Deleted

## 2011-10-04 ENCOUNTER — Encounter (HOSPITAL_COMMUNITY)
Admission: RE | Disposition: A | Discharge status: Home / Self Care | Payer: Self-pay | Source: Ambulatory Visit | Attending: Vascular Surgery

## 2011-10-04 DIAGNOSIS — N186 End stage renal disease: Secondary | ICD-10-CM | POA: Insufficient documentation

## 2011-10-04 DIAGNOSIS — Z48812 Encounter for surgical aftercare following surgery on the circulatory system: Secondary | ICD-10-CM

## 2011-10-04 DIAGNOSIS — T82598A Other mechanical complication of other cardiac and vascular devices and implants, initial encounter: Secondary | ICD-10-CM

## 2011-10-04 DIAGNOSIS — Z01812 Encounter for preprocedural laboratory examination: Secondary | ICD-10-CM | POA: Insufficient documentation

## 2011-10-04 DIAGNOSIS — T82898A Other specified complication of vascular prosthetic devices, implants and grafts, initial encounter: Secondary | ICD-10-CM

## 2011-10-04 HISTORY — PX: AV FISTULA PLACEMENT: SHX1204

## 2011-10-04 LAB — POCT I-STAT 4, (NA,K, GLUC, HGB,HCT)
Glucose, Bld: 107 mg/dL — ABNORMAL HIGH (ref 70–99)
HCT: 35 % — ABNORMAL LOW (ref 39.0–52.0)
Potassium: 4 mEq/L (ref 3.5–5.1)
Sodium: 141 mEq/L (ref 135–145)

## 2011-10-04 SURGERY — LIGATION OF ARTERIOVENOUS  FISTULA
Anesthesia: Monitor Anesthesia Care | Site: Wrist | Laterality: Right | Wound class: Clean

## 2011-10-04 MED ORDER — 0.9 % SODIUM CHLORIDE (POUR BTL) OPTIME
TOPICAL | Status: DC | PRN
  Administered 2011-10-04: 1000 mL

## 2011-10-04 MED ORDER — LIDOCAINE HCL (CARDIAC) 20 MG/ML IV SOLN
INTRAVENOUS | Status: DC | PRN
  Administered 2011-10-04: 40 mg via INTRAVENOUS

## 2011-10-04 MED ORDER — MIDAZOLAM HCL 5 MG/5ML IJ SOLN
INTRAMUSCULAR | Status: DC | PRN
  Administered 2011-10-04: 1 mg via INTRAVENOUS

## 2011-10-04 MED ORDER — FENTANYL CITRATE 0.05 MG/ML IJ SOLN
INTRAMUSCULAR | Status: DC | PRN
  Administered 2011-10-04 (×2): 25 ug via INTRAVENOUS

## 2011-10-04 MED ORDER — MORPHINE SULFATE 2 MG/ML IJ SOLN: 0.0500 mg/kg | INTRAMUSCULAR | Status: DC | PRN

## 2011-10-04 MED ORDER — SODIUM CHLORIDE 0.9 % IR SOLN
Status: DC | PRN
  Administered 2011-10-04: 11:00:00

## 2011-10-04 MED ORDER — HEPARIN SODIUM (PORCINE) 1000 UNIT/ML IJ SOLN
INTRAMUSCULAR | Status: DC | PRN
  Administered 2011-10-04: 5000 [IU] via INTRAVENOUS

## 2011-10-04 MED ORDER — ONDANSETRON HCL 4 MG/2ML IJ SOLN: 4.0000 mg | Freq: Once | INTRAMUSCULAR | Status: DC | PRN

## 2011-10-04 MED ORDER — LIDOCAINE HCL (PF) 1 % IJ SOLN
INTRAMUSCULAR | Status: DC | PRN
  Administered 2011-10-04: 30 mL

## 2011-10-04 MED ORDER — ONDANSETRON HCL 4 MG/2ML IJ SOLN
INTRAMUSCULAR | Status: DC | PRN
  Administered 2011-10-04: 4 mg via INTRAVENOUS

## 2011-10-04 MED ORDER — PROPOFOL 10 MG/ML IV EMUL
INTRAVENOUS | Status: DC | PRN
  Administered 2011-10-04: 100 ug/kg/min via INTRAVENOUS

## 2011-10-04 MED ORDER — SODIUM CHLORIDE 0.9 % IV SOLN
INTRAVENOUS | Status: DC
  Administered 2011-10-04: 20 mL/h via INTRAVENOUS

## 2011-10-04 MED ORDER — HYDROMORPHONE HCL PF 1 MG/ML IJ SOLN: 0.2500 mg | INTRAMUSCULAR | Status: DC | PRN

## 2011-10-04 MED ORDER — SODIUM CHLORIDE 0.9 % IV SOLN
INTRAVENOUS | Status: DC | PRN
  Administered 2011-10-04: 11:00:00 via INTRAVENOUS

## 2011-10-04 MED ORDER — METOPROLOL SUCCINATE ER 25 MG PO TB24
25.0000 mg | ORAL_TABLET | ORAL | Status: AC
  Administered 2011-10-04: 25 mg via ORAL
  Filled 2011-10-04: qty 1

## 2011-10-04 MED ORDER — TRAMADOL HCL 50 MG PO TABS: 50.0000 mg | ORAL_TABLET | Freq: Two times a day (BID) | ORAL | Status: DC | PRN

## 2011-10-04 SURGICAL SUPPLY — 43 items
CANISTER SUCTION 2500CC (MISCELLANEOUS) ×4 IMPLANT
CLIP TI MEDIUM 6 (CLIP) ×4 IMPLANT
CLIP TI WIDE RED SMALL 6 (CLIP) ×8 IMPLANT
CLOTH BEACON ORANGE TIMEOUT ST (SAFETY) ×4 IMPLANT
COVER PROBE W GEL 5X96 (DRAPES) ×4 IMPLANT
COVER SURGICAL LIGHT HANDLE (MISCELLANEOUS) ×8 IMPLANT
DECANTER SPIKE VIAL GLASS SM (MISCELLANEOUS) ×4 IMPLANT
DERMABOND ADHESIVE PROPEN (GAUZE/BANDAGES/DRESSINGS) ×1
DERMABOND ADVANCED (GAUZE/BANDAGES/DRESSINGS) ×1
DERMABOND ADVANCED .7 DNX12 (GAUZE/BANDAGES/DRESSINGS) ×3 IMPLANT
DERMABOND ADVANCED .7 DNX6 (GAUZE/BANDAGES/DRESSINGS) ×3 IMPLANT
DRAIN PENROSE 1/4X12 LTX STRL (WOUND CARE) ×4 IMPLANT
ELECT REM PT RETURN 9FT ADLT (ELECTROSURGICAL) ×4
ELECTRODE REM PT RTRN 9FT ADLT (ELECTROSURGICAL) ×3 IMPLANT
GAUZE SPONGE 2X2 8PLY STRL LF (GAUZE/BANDAGES/DRESSINGS) ×3 IMPLANT
GEL ULTRASOUND 20GR AQUASONIC (MISCELLANEOUS) IMPLANT
GLOVE BIO SURGEON STRL SZ 6.5 (GLOVE) ×8 IMPLANT
GLOVE BIO SURGEON STRL SZ7.5 (GLOVE) ×4 IMPLANT
GLOVE BIOGEL PI IND STRL 7.0 (GLOVE) ×6 IMPLANT
GLOVE BIOGEL PI INDICATOR 7.0 (GLOVE) ×2
GLOVE EXAM NITRILE XL STR (GLOVE) ×4 IMPLANT
GLOVE SURG SS PI 7.5 STRL IVOR (GLOVE) ×4 IMPLANT
GOWN PREVENTION PLUS XLARGE (GOWN DISPOSABLE) ×4 IMPLANT
GOWN STRL NON-REIN LRG LVL3 (GOWN DISPOSABLE) ×8 IMPLANT
GOWN STRL REIN XL XLG (GOWN DISPOSABLE) ×4 IMPLANT
KIT BASIN OR (CUSTOM PROCEDURE TRAY) ×4 IMPLANT
KIT ROOM TURNOVER OR (KITS) ×4 IMPLANT
LOOP VESSEL MINI RED (MISCELLANEOUS) IMPLANT
NS IRRIG 1000ML POUR BTL (IV SOLUTION) ×4 IMPLANT
PACK CV ACCESS (CUSTOM PROCEDURE TRAY) ×4 IMPLANT
PAD ARMBOARD 7.5X6 YLW CONV (MISCELLANEOUS) ×8 IMPLANT
SPONGE GAUZE 2X2 STER 10/PKG (GAUZE/BANDAGES/DRESSINGS) ×1
SPONGE SURGIFOAM ABS GEL 100 (HEMOSTASIS) IMPLANT
SUT PROLENE 7 0 BV 1 (SUTURE) ×4 IMPLANT
SUT SILK 3 0 (SUTURE) ×1
SUT SILK 3-0 18XBRD TIE 12 (SUTURE) ×3 IMPLANT
SUT VIC AB 3-0 SH 27 (SUTURE) ×2
SUT VIC AB 3-0 SH 27X BRD (SUTURE) ×6 IMPLANT
SUT VICRYL 4-0 PS2 18IN ABS (SUTURE) ×8 IMPLANT
TOWEL OR 17X24 6PK STRL BLUE (TOWEL DISPOSABLE) ×4 IMPLANT
TOWEL OR 17X26 10 PK STRL BLUE (TOWEL DISPOSABLE) ×4 IMPLANT
UNDERPAD 30X30 INCONTINENT (UNDERPADS AND DIAPERS) ×4 IMPLANT
WATER STERILE IRR 1000ML POUR (IV SOLUTION) ×4 IMPLANT

## 2011-10-04 NOTE — Preoperative (Signed)
Beta Blockers   Reason not to administer Beta Blockers:Not Applicable, took Toprol this AM

## 2011-10-04 NOTE — Op Note (Signed)
Procedure: 1.  Ligation of right radial cephalic AV fistula   2.  Left Brachial Cephalic AV fistula  Preop: ESRD  Postop: ESRD  Anesthesia: General  Assistant:Samantha Rhyne, PAC  Findings: 3.5 mm cephalic vein  Procedure: After obtaining informed consent, the patient was taken to the operating room. After adequate sedation, the right upper extremity was prepped and draped in usual sterile fashion. Local anesthesia was infiltrated near the right wrist through a preexisting scar.  A longitudinal incision was made through the scar and carried down through the subcutaneous tissues to the level of the existing radial cephalic AVF.  This was pulsatile.  It was dissected free circumferentially over several centimeters and down to the level of the arterial anastomosis.  The patient was given 5000 units of intravenous heparin.  After 2 minutes of circulation time the vein was clamped proximally and distally. An 11 blade was used to open the vein longitudinally.  There was diffuse intimal hyperplasia nearly obliterating the lumen of the vein extending over several centimeters.  I did not feel that a patch or revision would make the fistula usable. The vein was ligated just above the arterial anastomosis with a 3 0 silk tie and on the vein distally.  The subcutaneous tissues were closed with a running 3 0 Vicryl suture.  The skin was closed with a 4 0 Vicryl subcuticular stitch.  Local anesthesia was infiltrated near the antecubital crease.   A transverse incision was then made near the antecubital crease the right arm. The incision was carried into the subcutaneous tissues down to level of the cephalic vein. The cephalic vein was approximately 3.5 mm in diameter. It was of good quality. This was dissected free circumferentially and small side branches ligated and divided between silk ties or clips. Next the brachial artery was dissected free in the medial portion of the incision. The artery was  5 mm in  diameter. The vessel loops were placed proximal and distal to the planned site of arteriotomy. After appropriate circulation time, the vessel loops were used to control the artery. A longitudinal opening was made in the brachial artery.  The vein was ligated distally with a 2-0 silk tie. The vein was controlled proximally with a fine bulldog clamp. The vein was then swung over to the artery and sewn end of vein to side of artery using a running 7-0 Prolene suture. Just prior to completion of the anastomosis, everything was fore bled back bled and thoroughly flushed. The anastomosis was secured, vessel loops released, and there was a palpable thrill in the fistula immediately. After hemostasis was obtained, the subcutaneous tissues were reapproximated using a running 3-0 Vicryl suture. The skin was then closed with a 4 Vicryl subcuticular stitch. Dermabond was applied to the skin incision.    Ruta Hinds, MD [facility] [place] Office: [phone] Pager: [phone]

## 2011-10-04 NOTE — Anesthesia Preprocedure Evaluation (Signed)
Anesthesia Evaluation  Patient identified by MRN, date of birth, ID band Patient awake    Reviewed: Allergy & Precautions, H&P , NPO status , Patient's Chart, lab work & pertinent test results, reviewed documented beta blocker date and time   Airway Mallampati: I TM Distance: >3 FB     Dental  (+) Teeth Intact and Dental Advisory Given   Pulmonary  breath sounds clear to auscultation        Cardiovascular Exercise Tolerance: Good Rhythm:Regular Rate:Normal     Neuro/Psych    GI/Hepatic   Endo/Other    Renal/GU      Musculoskeletal   Abdominal   Peds  Hematology   Anesthesia Other Findings   Reproductive/Obstetrics                           Anesthesia Physical Anesthesia Plan  ASA: III  Anesthesia Plan: MAC   Post-op Pain Management:    Induction: Intravenous  Airway Management Planned: Simple Face Mask  Additional Equipment:   Intra-op Plan:   Post-operative Plan:   Informed Consent: I have reviewed the patients History and Physical, chart, labs and discussed the procedure including the risks, benefits and alternatives for the proposed anesthesia with the patient or authorized representative who has indicated his/her understanding and acceptance.   Dental advisory given  Plan Discussed with: CRNA, Anesthesiologist and Surgeon  Anesthesia Plan Comments:         Anesthesia Quick Evaluation

## 2011-10-04 NOTE — Telephone Encounter (Signed)
Left message for patient with appt date/time per [facility] staff message

## 2011-10-04 NOTE — Progress Notes (Signed)
Call to Call [facility]- requesting pacemaker/device order. Call to [employer] to report pt.'s presence in [facility], headed to OR at 0930.  Call to Pharmacy, requested Toprol to be tubed to [facility].  All activity in this note took place between [phone]

## 2011-10-04 NOTE — Discharge Instructions (Signed)
    10/04/2011 Timothy Carney ID:[id number] 12-29-1925  Surgeon(s): Elam Dutch, MD  Procedure(s): LIGATION OF ARTERIOVENOUS  FISTULA ARTERIOVENOUS (AV) FISTULA CREATION Creation right brachiocephalic AVF        X Do not stick graft for 12 weeks

## 2011-10-04 NOTE — H&P (View-Only) (Signed)
Patient had a right radiocephalic AV fistula placed 07/17/2011. He returns today for further followup. He denies any symptoms of numbness or tingling in the hand. He is currently not on dialysis. However recent note from Dr. Moshe Cipro suggested that he may need this soon.  Physical exam: Filed Vitals:   09/21/11 1451  BP: 143/89  Pulse: 75  Temp: 97.8 F (36.6 C)  TempSrc: Oral  Height: 5\' 9"  (1.753 m)  Weight: 174 lb (78.926 kg)  SpO2: 99%    Right upper extremity: Well-healed wrist incision, fistula has a palpable thrill but seems to have diminished flow distally. Duplex ultrasound today shows narrowing of the proximal few centimeters of the fistula.  Assessment: Proximal narrowing of right radiocephalic AV fistula.  Plan: Will revise in the operating room on 10/04/2011.  Procedure risks benefits possible complications discussed the patient and his family today.  Ruta Hinds, MD [facility] [place] Office: [phone] Pager: [phone]

## 2011-10-04 NOTE — Telephone Encounter (Signed)
Message copied by Renaldo Harrison on Wed Oct 04, 2011  2:26 PM ------      Message from: Ruta Hinds E      Created: Wed Oct 04, 2011 12:59 PM       Ligation radial cephalic AVF      Placement new brachial cephalic AVF      [place] assist      Follow up 1 month            Juanda Crumble

## 2011-10-04 NOTE — Anesthesia Postprocedure Evaluation (Signed)
  Anesthesia Post-op Note  Patient: Timothy Carney  Procedure(s) Performed: Procedure(s) (LRB): LIGATION OF ARTERIOVENOUS  FISTULA (Right) ARTERIOVENOUS (AV) FISTULA CREATION ()  Patient Location: PACU  Anesthesia Type: MAC  Level of Consciousness: awake, alert  and oriented  Airway and Oxygen Therapy: Patient Spontanous Breathing  Post-op Pain: mild  Post-op Assessment: Post-op Vital signs reviewed, Patient's Cardiovascular Status Stable, Respiratory Function Stable, Patent Airway, No signs of Nausea or vomiting and Pain level controlled  Post-op Vital Signs: Reviewed  Complications: No apparent anesthesia complications

## 2011-10-04 NOTE — Transfer of Care (Signed)
Immediate Anesthesia Transfer of Care Note  Patient: Timothy Carney  Procedure(s) Performed: Procedure(s) (LRB): LIGATION OF ARTERIOVENOUS  FISTULA (Right) ARTERIOVENOUS (AV) FISTULA CREATION ()  Patient Location: PACU  Anesthesia Type: MAC  Level of Consciousness: awake, oriented and patient cooperative  Airway & Oxygen Therapy: Patient Spontanous Breathing and Patient connected to nasal cannula oxygen  Post-op Assessment: Report given to PACU RN and Post -op Vital signs reviewed and stable  Post vital signs: Reviewed and stable  Complications: No apparent anesthesia complications

## 2011-10-04 NOTE — Interval H&P Note (Signed)
History and Physical Interval Note:  10/04/2011 10:14 AM  Timothy Carney  has presented today for surgery, with the diagnosis of End Stage Renal Disease  The various methods of treatment have been discussed with the patient and family. After consideration of risks, benefits and other options for treatment, the patient has consented to  Procedure(s) (LRB): REVISON OF ARTERIOVENOUS FISTULA (Left) as a surgical intervention .  The patients' history has been reviewed, patient examined, no change in status, stable for surgery.  I have reviewed the patients' chart and labs.  Questions were answered to the patient's satisfaction.     [name] E

## 2011-10-05 ENCOUNTER — Encounter (HOSPITAL_COMMUNITY): Payer: Self-pay | Admitting: Vascular Surgery

## 2011-11-09 ENCOUNTER — Ambulatory Visit: Payer: Medicare Other | Admitting: Vascular Surgery

## 2011-11-15 ENCOUNTER — Encounter: Payer: Self-pay | Admitting: Vascular Surgery

## 2011-11-16 ENCOUNTER — Ambulatory Visit (INDEPENDENT_AMBULATORY_CARE_PROVIDER_SITE_OTHER): Payer: Medicare Other | Admitting: Vascular Surgery

## 2011-11-16 ENCOUNTER — Encounter: Payer: Self-pay | Admitting: Vascular Surgery

## 2011-11-16 VITALS — BP 122/75 | HR 73 | Resp 18 | Ht 69.0 in | Wt 175.0 lb

## 2011-11-16 DIAGNOSIS — Z48812 Encounter for surgical aftercare following surgery on the circulatory system: Secondary | ICD-10-CM

## 2011-11-16 DIAGNOSIS — T82598A Other mechanical complication of other cardiac and vascular devices and implants, initial encounter: Secondary | ICD-10-CM

## 2011-11-16 DIAGNOSIS — N186 End stage renal disease: Secondary | ICD-10-CM

## 2011-11-16 NOTE — Progress Notes (Signed)
Right brachiocephalic AVF duplex performed @ [facility] 11/16/2011

## 2011-11-16 NOTE — Progress Notes (Addendum)
[facility] [place] Postoperative Visit hemodialysis access   Date of Surgery:   10/04/11 Surgeon: Oneida Alar HD:  no HD:  Days:  n/a  CC: f/u for right brachiocephalic AVF placement  HPI:  This is a 78 y.o. male who returns today s/p  right brachiocephalic AVF placement.  He is doing well and denies any symptoms of steal.  He returns today for maturation of his AVF.  PHYSICAL EXAMINATION:  Filed Vitals:   11/16/11 1541  BP: 122/75  Pulse: 73  Resp: 18     Incision is healing well Hand grip is equal bilaterally and sensation in digits is intact; There is  Thrill; there is bruit. The graft/fistula is easily palpable  Pulse:  + palpable right radial pulse  ASSESSMENT/PLAN:  Timothy Carney is a 78 y.o. year old male who presents s/p  right brachiocephalic AVF placement.  -It is maturing nicely and should be ready to use August 1st.  -He is instructed to continue to exercise his right arm with a can of soup or light hand weight. -He is to f/u with [place] on an as needed basis.  Leontine Locket, PA-C [facility] [phone]  Clinic MD:   Pt is seen and examined in conjunction with Dr. Oneida Alar.  History and exam details as above. The fistula is palpable throughout its entire course. Duplex ultrasound today shows it is maturing well. It should be ready for use if he requires in the future. He will followup with [place] on as-needed basis.  Ruta Hinds, MD [facility] [place] Office: [phone] Pager: [phone]

## 2011-11-24 NOTE — Procedures (Unsigned)
VASCULAR LAB EXAM  INDICATION:  Chronic kidney disease, stage 4 and 5; maturation of fistula.  HISTORY: Diabetes: Cardiac: Hypertension:  EXAM:  Right brachiocephalic arteriovenous fistula duplex.  IMPRESSION: 1. Patent arterial inflow and outflow. 2. Patent arteriovenous fistula anastomosis, elevated velocities     present as noted on worksheet; however, no thrombus or valve     leaflets identified. 3. Competing branch noted in the right mid upper arm venous outflow     segment measuring 0.33 cm in diameter and a peak systolic velocity     of [id number] . 4. Right venous outflow appears patent to the level of the subclavian     confluence.  ___________________________________________ Jessy Oto. Fields, MD  SH/MEDQ  D:  11/16/2011  T:  11/16/2011  Job:  [id number]

## 2016-01-06 DIAGNOSIS — K5730 Diverticulosis of large intestine without perforation or abscess without bleeding: Secondary | ICD-10-CM

## 2016-01-06 DIAGNOSIS — N185 Chronic kidney disease, stage 5: Secondary | ICD-10-CM

## 2016-01-06 DIAGNOSIS — I495 Sick sinus syndrome: Secondary | ICD-10-CM

## 2016-01-06 DIAGNOSIS — F419 Anxiety disorder, unspecified: Secondary | ICD-10-CM | POA: Insufficient documentation

## 2016-01-06 DIAGNOSIS — I120 Hypertensive chronic kidney disease with stage 5 chronic kidney disease or end stage renal disease: Secondary | ICD-10-CM | POA: Insufficient documentation

## 2016-01-06 DIAGNOSIS — E782 Mixed hyperlipidemia: Secondary | ICD-10-CM | POA: Insufficient documentation

## 2016-01-06 DIAGNOSIS — N186 End stage renal disease: Secondary | ICD-10-CM

## 2016-01-06 DIAGNOSIS — I10 Essential (primary) hypertension: Secondary | ICD-10-CM

## 2016-01-06 DIAGNOSIS — I471 Supraventricular tachycardia, unspecified: Secondary | ICD-10-CM | POA: Insufficient documentation

## 2016-01-06 DIAGNOSIS — M109 Gout, unspecified: Secondary | ICD-10-CM

## 2016-01-06 HISTORY — DX: Essential (primary) hypertension: I10

## 2016-01-06 HISTORY — DX: Diverticulosis of large intestine without perforation or abscess without bleeding: K57.30

## 2016-01-06 HISTORY — DX: Mixed hyperlipidemia: E78.2

## 2016-01-06 HISTORY — DX: Supraventricular tachycardia, unspecified: I47.10

## 2016-01-06 HISTORY — DX: Gout, unspecified: M10.9

## 2016-01-06 HISTORY — DX: Chronic kidney disease, stage 5: N18.5

## 2016-01-06 HISTORY — DX: Supraventricular tachycardia: I47.1

## 2016-01-06 HISTORY — DX: Sick sinus syndrome: I49.5

## 2016-02-13 DIAGNOSIS — Z950 Presence of cardiac pacemaker: Secondary | ICD-10-CM | POA: Insufficient documentation

## 2016-05-16 DIAGNOSIS — G894 Chronic pain syndrome: Secondary | ICD-10-CM

## 2016-05-16 HISTORY — DX: Chronic pain syndrome: G89.4

## 2017-12-02 NOTE — Progress Notes (Deleted)
Cardiology Office Note:    Date:  12/03/2017   ID:  Timothy Carney, DOB 07-Jan-1926, MRN [id number]  PCP:  [name], MD  Cardiologist:  Shirlee More, MD   Referring MD: [name], MD  ASSESSMENT:    1. Pacemaker   2. Hypertensive kidney disease with ESRD (end-stage renal disease) ([place])   3. Sick sinus syndrome ([place])   4. PSVT (paroxysmal supraventricular tachycardia) ([facility])    PLAN:    In order of problems listed above:  1. ***  Next appointment   Medication Adjustments/Labs and Tests Ordered: Current medicines are reviewed at length with the patient today.  Concerns regarding medicines are outlined above.  No orders of the defined types were placed in this encounter.  No orders of the defined types were placed in this encounter.    No chief complaint on file. ***  History of Present Illness:    Timothy Carney is a 84 y.o. male with a hsitory of hypertension, CKD,with ESRD and RRT,  gout and SVT as well as a  [employer] dual chamber pacemaker for sick sinus syndrome who is being seen today for follow up  at the request of [name], MD. His last documented device check was 01/03/16 at [facility] in [place]- normal function.   Past Medical History:  Diagnosis Date  . Anemia   . Anxiety    Takes Ativan  . Benign hypertension 01/06/2016  . Blood transfusion 2012  . Chronic kidney disease    FOLLOWED BY DR Moshe Cipro  . Chronic kidney disease, stage 5, kidney failure ([place]) 01/06/2016   managed NEPH 2016: 12 2017: dialysis  . Chronic pain syndrome 05/16/2016   2016: onset 2017: opiates  . Diverticulitis 2012  . Diverticulosis of colon 01/06/2016   2016: diverticulitis  . End stage renal disease ([place]) 07/13/2011  . GERD (gastroesophageal reflux disease)   . Gout 01/06/2016  . Hyperlipidemia, mixed 01/06/2016  . Hypertension    takes Metoprolol but pt states it makes blood pressure drop;   Marland Kitchen Mechanical complication of other vascular device, implant,  and graft 09/21/2011  . Pacemaker 07/12/10   Dual Chamber Pacemaker ( Dr. Agustin Cree at [facility])  . Pneumonia April 2012  . PSVT (paroxysmal supraventricular tachycardia) ([place]) 01/06/2016  . Secondary hyperparathyroidism ([place])   . Sick sinus syndrome ([place]) 01/06/2016   pacemaker    Past Surgical History:  Procedure Laterality Date  . AV FISTULA PLACEMENT  07/17/2011   Procedure: ARTERIOVENOUS (AV) FISTULA CREATION;  Surgeon: Elam Dutch, MD;  Location: [facility] OR;  Service: Vascular;  Laterality: Right;  . AV FISTULA PLACEMENT  10/04/2011   Procedure: ARTERIOVENOUS (AV) FISTULA CREATION;  Surgeon: Elam Dutch, MD;  Location: [facility];  Service: Vascular;;  . Lillard Anes    . HERNIA REPAIR    . INSERT / REPLACE / REMOVE PACEMAKER  07/2010  . PROSTATE SURGERY      Current Medications: No outpatient medications have been marked as taking for the 12/03/17 encounter (Appointment) with Richardo Priest, MD.     Allergies:   Patient has no known allergies.   Social History   Socioeconomic History  . Marital status: Married    Spouse name: Not on file  . Number of children: Not on file  . Years of education: Not on file  . Highest education level: Not on file  Occupational History  . Not on file  Social Needs  . Financial resource strain: Not  on file  . Food insecurity:    Worry: Not on file    Inability: Not on file  . Transportation needs:    Medical: Not on file    Non-medical: Not on file  Tobacco Use  . Smoking status: Former Smoker    Packs/day: 1.50    Years: 20.00    Pack years: 30.00    Types: Cigarettes    Last attempt to quit: 11/16/1959    Years since quitting: 58.0  . Smokeless tobacco: Never Used  Substance and Sexual Activity  . Alcohol use: No  . Drug use: No  . Sexual activity: Not on file  Lifestyle  . Physical activity:    Days per week: Not on file    Minutes per session: Not on file  . Stress: Not on file  Relationships  . Social  connections:    Talks on phone: Not on file    Gets together: Not on file    Attends religious service: Not on file    Active member of club or organization: Not on file    Attends meetings of clubs or organizations: Not on file    Relationship status: Not on file  Other Topics Concern  . Not on file  Social History Narrative  . Not on file     Family History: The patient's ***family history includes Diabetes in his sister; Heart disease in his mother; Hypertension in his brother, daughter, father, sister, and son; Rheum arthritis in his mother and sister; Stroke in his father. There is no history of Anesthesia problems, Hypotension, Malignant hyperthermia, or Pseudochol deficiency.  ROS:   ROS Please see the history of present illness.    *** All other systems reviewed and are negative.  EKGs/Labs/Other Studies Reviewed:    The following studies were reviewed today: ***  EKG:  EKG is *** ordered today.  The ekg ordered today demonstrates ***  Recent Labs: No results found for requested labs within last 8760 hours.  Recent Lipid Panel No results found for: CHOL, TRIG, HDL, CHOLHDL, VLDL, LDLCALC, LDLDIRECT  Physical Exam:    VS:  There were no vitals taken for this visit.    Wt Readings from Last 3 Encounters:  11/16/11 175 lb (79.4 kg)  10/04/11 175 lb 11.3 oz (79.7 kg)  09/28/11 175 lb 11.2 oz (79.7 kg)     GEN: *** Well nourished, well developed in no acute distress HEENT: Normal NECK: No JVD; No carotid bruits LYMPHATICS: No lymphadenopathy CARDIAC: ***RRR, no murmurs, rubs, gallops RESPIRATORY:  Clear to auscultation without rales, wheezing or rhonchi  ABDOMEN: Soft, non-tender, non-distended MUSCULOSKELETAL:  No edema; No deformity  SKIN: Warm and dry NEUROLOGIC:  Alert and oriented x 3 PSYCHIATRIC:  Normal affect     Signed, Shirlee More, MD  12/03/2017 8:11 AM    [place]

## 2017-12-03 ENCOUNTER — Ambulatory Visit: Payer: Self-pay | Admitting: Cardiology

## 2017-12-07 ENCOUNTER — Encounter: Payer: Self-pay | Admitting: Cardiology

## 2018-03-19 ENCOUNTER — Ambulatory Visit: Payer: Medicare HMO | Admitting: Cardiology

## 2018-04-04 ENCOUNTER — Ambulatory Visit: Payer: Medicare HMO | Admitting: Cardiology

## 2018-04-15 ENCOUNTER — Ambulatory Visit (INDEPENDENT_AMBULATORY_CARE_PROVIDER_SITE_OTHER): Payer: Medicare HMO | Admitting: Cardiology

## 2018-04-15 ENCOUNTER — Encounter: Payer: Self-pay | Admitting: Cardiology

## 2018-04-15 VITALS — BP 122/64 | HR 81 | Ht 69.0 in | Wt 158.6 lb

## 2018-04-15 DIAGNOSIS — I495 Sick sinus syndrome: Secondary | ICD-10-CM

## 2018-04-15 DIAGNOSIS — I471 Supraventricular tachycardia, unspecified: Secondary | ICD-10-CM

## 2018-04-15 DIAGNOSIS — Z950 Presence of cardiac pacemaker: Secondary | ICD-10-CM

## 2018-04-15 DIAGNOSIS — N186 End stage renal disease: Secondary | ICD-10-CM | POA: Diagnosis not present

## 2018-04-15 DIAGNOSIS — E782 Mixed hyperlipidemia: Secondary | ICD-10-CM | POA: Diagnosis not present

## 2018-04-15 DIAGNOSIS — N185 Chronic kidney disease, stage 5: Secondary | ICD-10-CM

## 2018-04-15 NOTE — Progress Notes (Signed)
Cardiology Consultation:    Date:  04/15/2018   ID:  Timothy Carney, DOB 14-Jul-1925, MRN [id number]  PCP:  [name], MD  Cardiologist:  Jenne Campus, MD   Referring MD: [name], MD   No chief complaint on file. I am doing fine  History of Present Illness:    Timothy Carney is a 84 y.o. male who is being seen today for the evaluation of pacemaker at the request of Dough, Jaymes Graff, MD.  He was a patient of mine few years ago up with dual-chamber pacemaker in about 8 years ago.  He disappeared from follow-up after we left [place].  Now he back in town and he show obstipation.  Overall doing well in the meantime he started being dialyzed to get dialysis Tuesday Thursday and Saturday.  Denies having any problems he described the fact that anytime the check his heart rate his heart rate seems to be irregular.  No chest pain tightness squeezing pressure burning chest.  He said the days of dialysis he will feel very poorly and think he cannot do much but today when he does not have dialysis he is feeling a little better he is able to walk a little bit around.  No swelling of lower extremities no dizziness no passing out..   Past Medical History:  Diagnosis Date  . Anemia   . Anxiety    Takes Ativan  . Benign hypertension 01/06/2016  . Blood transfusion 2012  . Chronic kidney disease    FOLLOWED BY DR Moshe Cipro  . Chronic kidney disease, stage 5, kidney failure ([place]) 01/06/2016   managed NEPH 2016: 12 2017: dialysis  . Chronic pain syndrome 05/16/2016   2016: onset 2017: opiates  . Diverticulitis 2012  . Diverticulosis of colon 01/06/2016   2016: diverticulitis  . End stage renal disease ([place]) 07/13/2011  . GERD (gastroesophageal reflux disease)   . Gout 01/06/2016  . Hyperlipidemia, mixed 01/06/2016  . Hypertension    takes Metoprolol but pt states it makes blood pressure drop;   Marland Kitchen Mechanical complication of other vascular device, implant, and graft 09/21/2011  .  Pacemaker 07/12/10   Dual Chamber Pacemaker ( Dr. Agustin Cree at [facility])  . Pneumonia April 2012  . PSVT (paroxysmal supraventricular tachycardia) ([place]) 01/06/2016  . Secondary hyperparathyroidism ([place])   . Sick sinus syndrome ([place]) 01/06/2016   pacemaker    Past Surgical History:  Procedure Laterality Date  . AV FISTULA PLACEMENT  07/17/2011   Procedure: ARTERIOVENOUS (AV) FISTULA CREATION;  Surgeon: Elam Dutch, MD;  Location: [facility];  Service: Vascular;  Laterality: Right;  . AV FISTULA PLACEMENT  10/04/2011   Procedure: ARTERIOVENOUS (AV) FISTULA CREATION;  Surgeon: Elam Dutch, MD;  Location: [place];  Service: Vascular;;  . Lillard Anes    . HERNIA REPAIR    . INSERT / REPLACE / REMOVE PACEMAKER  07/2010  . PROSTATE SURGERY      Current Medications: Current Meds  Medication Sig  . allopurinol (ZYLOPRIM) 100 MG tablet Take 2 tablets by mouth daily.  Marland Kitchen aspirin 81 MG tablet Take 81 mg by mouth daily.   . cinacalcet (SENSIPAR) 30 MG tablet TAKE 1 TABLET BY MOUTH DAILY WITH FOOD (DO NOT TAKE LESS THAN 12 HOURS PRIOR TO DIALYSIS)  . Cyanocobalamin (VITAMIN B-12 CR PO) Take 1,000 mcg by mouth daily.   Marland Kitchen NITROSTAT 0.4 MG SL tablet Place 0.4 mg under the tongue every 5 (five) minutes as needed. For  chest pain  . ondansetron (ZOFRAN) 4 MG tablet Take 1 tablet by mouth as needed for nausea.  . sevelamer carbonate (RENVELA) 800 MG tablet Take 1 tablet by mouth daily.  . tamsulosin (FLOMAX) 0.4 MG CAPS capsule Take 1 capsule by mouth 2 (two) times daily.  . vitamin E 400 UNIT capsule Take 400 Units by mouth daily.     Allergies:   Patient has no known allergies.   Social History   Socioeconomic History  . Marital status: Married    Spouse name: Not on file  . Number of children: Not on file  . Years of education: Not on file  . Highest education level: Not on file  Occupational History  . Not on file  Social Needs  . Financial resource strain: Not on file  . Food  insecurity:    Worry: Not on file    Inability: Not on file  . Transportation needs:    Medical: Not on file    Non-medical: Not on file  Tobacco Use  . Smoking status: Former Smoker    Packs/day: 1.50    Years: 20.00    Pack years: 30.00    Types: Cigarettes    Last attempt to quit: 11/16/1959    Years since quitting: 58.4  . Smokeless tobacco: Never Used  Substance and Sexual Activity  . Alcohol use: No  . Drug use: No  . Sexual activity: Not on file  Lifestyle  . Physical activity:    Days per week: Not on file    Minutes per session: Not on file  . Stress: Not on file  Relationships  . Social connections:    Talks on phone: Not on file    Gets together: Not on file    Attends religious service: Not on file    Active member of club or organization: Not on file    Attends meetings of clubs or organizations: Not on file    Relationship status: Not on file  Other Topics Concern  . Not on file  Social History Narrative  . Not on file     Family History: The patient's family history includes Diabetes in his sister; Heart disease in his mother; Hypertension in his brother, daughter, father, sister, and son; Rheum arthritis in his mother and sister; Stroke in his father. There is no history of Anesthesia problems, Hypotension, Malignant hyperthermia, or Pseudochol deficiency. ROS:   Please see the history of present illness.    All 14 point review of systems negative except as described per history of present illness.  EKGs/Labs/Other Studies Reviewed:    The following studies were reviewed today:    Recent Labs: No results found for requested labs within last 8760 hours.  Recent Lipid Panel No results found for: CHOL, TRIG, HDL, CHOLHDL, VLDL, LDLCALC, LDLDIRECT  Physical Exam:    VS:  BP 122/64   Pulse 81   Ht 5\' 9"  (1.753 m)   Wt 158 lb 9.6 oz (71.9 kg)   SpO2 98%   BMI 23.42 kg/m     Wt Readings from Last 3 Encounters:  04/15/18 158 lb 9.6 oz (71.9 kg)   11/16/11 175 lb (79.4 kg)  10/04/11 175 lb 11.3 oz (79.7 kg)     GEN:  Well nourished, well developed in no acute distress HEENT: Normal NECK: No JVD; No carotid bruits LYMPHATICS: No lymphadenopathy CARDIAC: RRR, no murmurs, no rubs, no gallops RESPIRATORY:  Clear to auscultation without rales, wheezing or rhonchi  ABDOMEN: Soft, non-tender, non-distended MUSCULOSKELETAL:  No edema; No deformity  SKIN: Warm and dry NEUROLOGIC:  Alert and oriented x 3 PSYCHIATRIC:  Normal affect   ASSESSMENT:    1. PSVT (paroxysmal supraventricular tachycardia) ([place])   2. Pacemaker   3. Hyperlipidemia, mixed   4. End stage renal disease ([place])   5. Sick sinus syndrome ([place])   6. Chronic kidney disease, stage 5, kidney failure ([facility])    PLAN:    In order of problems listed above:  1. Dual-chamber pacemaker inside.  Last interrogation 1/2-year ago.  I called unrelated pacemaker rep for interrogation of the device.  He denies having any dizziness or passing out.  Described to have some skipped beats.  We will do EKG today.  He will require echocardiogram which I will ask him to have.  Obviously he will be enrolled in our pacemaker clinic. 2. Chronic kidney disease he is on dialysis which I will continue.  History of paroxysmal supraventricular tachycardia.  Again I I even hear irregularity of his heart rate when I was listening to him I am worried about potentially having atrial fibrillation.  Obviously EKG will be done to clarify the diagnosis.   Medication Adjustments/Labs and Tests Ordered: Current medicines are reviewed at length with the patient today.  Concerns regarding medicines are outlined above.  No orders of the defined types were placed in this encounter.  No orders of the defined types were placed in this encounter.   Signed, Park Liter, MD, [facility]. 04/15/2018 4:01 PM    [facility]

## 2018-04-15 NOTE — Patient Instructions (Addendum)
Medication Instructions:  Your physician recommends that you continue on your current medications as directed. Please refer to the Current Medication list given to you today.  If you need a refill on your cardiac medications before your next appointment, please call your pharmacy.   Lab work: None.  If you have labs (blood work) drawn today and your tests are completely normal, you will receive your results only by: Marland Kitchen MyChart Message (if you have MyChart) OR . A paper copy in the mail If you have any lab test that is abnormal or we need to change your treatment, we will call you to review the results.  Testing/Procedures: Your physician has requested that you have an echocardiogram. Echocardiography is a painless test that uses sound waves to create images of your heart. It provides your doctor with information about the size and shape of your heart and how well your heart's chambers and valves are working. This procedure takes approximately one hour. There are no restrictions for this procedure.    Follow-Up: At [facility], you and your health needs are our priority.  As part of our continuing mission to provide you with exceptional heart care, we have created designated Provider Care Teams.  These Care Teams include your primary Cardiologist (physician) and Advanced Practice Providers (APPs -  Physician Assistants and Nurse Practitioners) who all work together to provide you with the care you need, when you need it. You will need a follow up appointment in 3 months.  Please call our office 2 months in advance to schedule this appointment.  You may see Jenne Campus, MD or another member of our [place] Provider Team in [place]: Shirlee More, MD . Jyl Heinz, MD  Any Other Special Instructions Will Be Listed Below (If Applicable).  Dr. Agustin Cree has referred you to see Dr. Curt Bears with electrophysiology.   Echocardiogram An echocardiogram, or echocardiography, uses  sound waves (ultrasound) to produce an image of your heart. The echocardiogram is simple, painless, obtained within a short period of time, and offers valuable information to your health care provider. The images from an echocardiogram can provide information such as:  Evidence of coronary artery disease (CAD).  Heart size.  Heart muscle function.  Heart valve function.  Aneurysm detection.  Evidence of a past heart attack.  Fluid buildup around the heart.  Heart muscle thickening.  Assess heart valve function.  Tell a health care provider about:  Any allergies you have.  All medicines you are taking, including vitamins, herbs, eye drops, creams, and over-the-counter medicines.  Any problems you or family members have had with anesthetic medicines.  Any blood disorders you have.  Any surgeries you have had.  Any medical conditions you have.  Whether you are pregnant or may be pregnant. What happens before the procedure? No special preparation is needed. Eat and drink normally. What happens during the procedure?  In order to produce an image of your heart, gel will be applied to your chest and a wand-like tool (transducer) will be moved over your chest. The gel will help transmit the sound waves from the transducer. The sound waves will harmlessly bounce off your heart to allow the heart images to be captured in real-time motion. These images will then be recorded.  You may need an IV to receive a medicine that improves the quality of the pictures. What happens after the procedure? You may return to your normal schedule including diet, activities, and medicines, unless your health care provider tells  you otherwise. This information is not intended to replace advice given to you by your health care provider. Make sure you discuss any questions you have with your health care provider. Document Released: 05/19/2000 Document Revised: 01/08/2016 Document Reviewed:  01/27/2013 Elsevier Interactive Patient Education  2017 [employer].

## 2018-06-10 ENCOUNTER — Ambulatory Visit (INDEPENDENT_AMBULATORY_CARE_PROVIDER_SITE_OTHER): Payer: Medicare HMO

## 2018-06-10 DIAGNOSIS — I471 Supraventricular tachycardia: Secondary | ICD-10-CM

## 2018-06-10 NOTE — Progress Notes (Signed)
Complete echocardiogram has been performed.  Jimmy Andrya Roppolo RDCS, RVT 

## 2018-06-14 ENCOUNTER — Telehealth: Payer: Self-pay | Admitting: Emergency Medicine

## 2018-06-14 NOTE — Telephone Encounter (Signed)
Left message for patient to return call regarding echocardiogram results

## 2018-06-19 ENCOUNTER — Telehealth: Payer: Self-pay | Admitting: Cardiology

## 2018-06-19 NOTE — Telephone Encounter (Signed)
Wants echo results

## 2018-06-19 NOTE — Telephone Encounter (Signed)
Patient informed of results.  

## 2018-06-24 ENCOUNTER — Encounter: Payer: Self-pay | Admitting: Cardiology

## 2018-06-24 ENCOUNTER — Telehealth: Payer: Self-pay

## 2018-06-24 ENCOUNTER — Ambulatory Visit (INDEPENDENT_AMBULATORY_CARE_PROVIDER_SITE_OTHER): Payer: Medicare HMO | Admitting: Cardiology

## 2018-06-24 VITALS — BP 110/50 | HR 79 | Ht 69.0 in | Wt 158.0 lb

## 2018-06-24 DIAGNOSIS — I471 Supraventricular tachycardia: Secondary | ICD-10-CM

## 2018-06-24 DIAGNOSIS — Z950 Presence of cardiac pacemaker: Secondary | ICD-10-CM | POA: Diagnosis not present

## 2018-06-24 DIAGNOSIS — I495 Sick sinus syndrome: Secondary | ICD-10-CM

## 2018-06-24 LAB — CUP PACEART INCLINIC DEVICE CHECK
Brady Statistic AP VP Percent: 2.57 %
Brady Statistic AP VS Percent: 75.58 %
Brady Statistic AS VP Percent: 0.18 %
Brady Statistic AS VS Percent: 21.67 %
Brady Statistic RA Percent Paced: 74 %
Brady Statistic RV Percent Paced: 3.05 %
Implantable Lead Implant Date: 20120207
Implantable Lead Implant Date: 20120207
Implantable Lead Location: 753860
Lead Channel Impedance Value: 456 Ohm
Lead Channel Pacing Threshold Amplitude: 1 V
Lead Channel Pacing Threshold Pulse Width: 0.2 ms
Lead Channel Sensing Intrinsic Amplitude: 1.364 mV
Lead Channel Sensing Intrinsic Amplitude: 12.05 mV
Lead Channel Setting Pacing Amplitude: 2 V
MDC IDC LEAD LOCATION: 753859
MDC IDC MSMT BATTERY VOLTAGE: 2.9 V
MDC IDC MSMT LEADCHNL RA IMPEDANCE VALUE: 360 Ohm
MDC IDC MSMT LEADCHNL RV PACING THRESHOLD AMPLITUDE: 1 V
MDC IDC MSMT LEADCHNL RV PACING THRESHOLD PULSEWIDTH: 0.2 ms
MDC IDC PG IMPLANT DT: 20120207
MDC IDC SESS DTM: 20200120131741
MDC IDC SET LEADCHNL RV PACING AMPLITUDE: 2.5 V
MDC IDC SET LEADCHNL RV PACING PULSEWIDTH: 0.4 ms
MDC IDC SET LEADCHNL RV SENSING SENSITIVITY: 0.9 mV

## 2018-06-24 NOTE — Telephone Encounter (Signed)
I tried to call the pt twice. The mailbox is full. I will try again in an hour.

## 2018-06-24 NOTE — Progress Notes (Signed)
Electrophysiology Office Note   Date:  06/24/2018   ID:  Timothy Carney, DOB [birth year], MRN [id number]  PCP:  Timothy Greenhouse, MD  Cardiologist:  Timothy Carney Primary Electrophysiologist:  Timothy Graciano Meredith Leeds, MD    No chief complaint on file.    History of Present Illness: Timothy Carney is a 85 y.o. male who is being seen today for the evaluation of pacemaker at the request of Dough, Jaymes Graff, MD. Presenting today for electrophysiology evaluation.  He has a history of hypertension, CKD stage V on dialysis, hyperlipidemia, SVT, sick sinus syndrome status post Medtronic pacemaker implantation.    Today, he denies symptoms of palpitations, chest pain, shortness of breath, orthopnea, PND, lower extremity edema, claudication, dizziness, presyncope, syncope, bleeding, or neurologic sequela. The patient is tolerating medications without difficulties.    Past Medical History:  Diagnosis Date  . Anemia   . Anxiety    Takes Ativan  . Benign hypertension 01/06/2016  . Blood transfusion 2012  . Chronic kidney disease    FOLLOWED BY DR Timothy Carney  . Chronic kidney disease, stage 5, kidney failure ([place]) 01/06/2016   managed NEPH 2016: 12 2017: dialysis  . Chronic pain syndrome 05/16/2016   2016: onset 2017: opiates  . Diverticulitis 2012  . Diverticulosis of colon 01/06/2016   2016: diverticulitis  . End stage renal disease ([place]) 07/13/2011  . GERD (gastroesophageal reflux disease)   . Gout 01/06/2016  . Hyperlipidemia, mixed 01/06/2016  . Hypertension    takes Metoprolol but pt states it makes blood pressure drop;   Marland Kitchen Mechanical complication of other vascular device, implant, and graft 09/21/2011  . Pacemaker 07/12/10   Dual Chamber Pacemaker ( Dr. Agustin Carney at [facility])  . Pneumonia April 2012  . PSVT (paroxysmal supraventricular tachycardia) ([place]) 01/06/2016  . Secondary hyperparathyroidism ([place])   . Sick sinus syndrome ([place]) 01/06/2016   pacemaker   Past Surgical History:    Procedure Laterality Date  . AV FISTULA PLACEMENT  07/17/2011   Procedure: ARTERIOVENOUS (AV) FISTULA CREATION;  Surgeon: Timothy Dutch, MD;  [facility]: [facility];  Service: Vascular;  Laterality: Right;  . AV FISTULA PLACEMENT  10/04/2011   Procedure: ARTERIOVENOUS (AV) FISTULA CREATION;  Surgeon: Timothy Dutch, MD;  Location: [place];  Service: Vascular;;  . Lillard Anes    . HERNIA REPAIR    . INSERT / REPLACE / REMOVE PACEMAKER  07/2010  . PROSTATE SURGERY       Current Outpatient Medications  Medication Sig Dispense Refill  . allopurinol (ZYLOPRIM) 100 MG tablet Take 2 tablets by mouth daily.    Marland Kitchen aspirin 81 MG tablet Take 81 mg by mouth daily.     . cinacalcet (SENSIPAR) 30 MG tablet TAKE 1 TABLET BY MOUTH DAILY WITH FOOD (DO NOT TAKE LESS THAN 12 HOURS PRIOR TO DIALYSIS)  12  . Cyanocobalamin (VITAMIN B-12 CR PO) Take 1,000 mcg by mouth daily.     Marland Kitchen NITROSTAT 0.4 MG SL tablet Place 0.4 mg under the tongue every 5 (five) minutes as needed. For chest pain    . ondansetron (ZOFRAN) 4 MG tablet Take 1 tablet by mouth as needed for nausea.    . sevelamer carbonate (RENVELA) 800 MG tablet Take 1 tablet by mouth daily.    . tamsulosin (FLOMAX) 0.4 MG CAPS capsule Take 1 capsule by mouth 2 (two) times daily.    . vitamin E 400 UNIT capsule Take 400 Units by mouth daily.     No  current facility-administered medications for this visit.     Allergies:   Patient has no known allergies.   Social History:  The patient  reports that he quit smoking about 58 years ago. His smoking use included cigarettes. He has a 30.00 pack-year smoking history. He has never used smokeless tobacco. He reports that he does not drink alcohol or use drugs.   Family History:  The patient's family history includes Diabetes in his sister; Heart disease in his mother; Hypertension in his brother, daughter, father, sister, and son; Rheum arthritis in his mother and sister; Stroke in his father.    ROS:  Please see the  history of present illness.   Otherwise, review of systems is positive for none.   All other systems are reviewed and negative.    PHYSICAL EXAM: VS:  BP (!) 110/50   Pulse 79   Ht 5\' 9"  (1.753 m)   Wt 158 lb (71.7 kg)   SpO2 98%   BMI 23.33 kg/m  , BMI Body mass index is 23.33 kg/m. GEN: Well nourished, well developed, in no acute distress  HEENT: normal  Neck: no JVD, carotid bruits, or masses Cardiac: RRR; no murmurs, rubs, or gallops,no edema  Respiratory:  clear to auscultation bilaterally, normal work of breathing GI: soft, nontender, nondistended, + BS MS: no deformity or atrophy  Skin: warm and dry, device pocket is well healed Neuro:  Strength and sensation are intact Psych: euthymic mood, full affect  EKG:  EKG is not ordered today. Personal review of the ekg ordered 04/15/18 shows atrial paced, PVC  Device interrogation is reviewed today in detail.  See PaceArt for details.   Recent Labs: No results found for requested labs within last 8760 hours.    Lipid Panel  No results found for: CHOL, TRIG, HDL, CHOLHDL, VLDL, LDLCALC, LDLDIRECT   Wt Readings from Last 3 Encounters:  06/24/18 158 lb (71.7 kg)  04/15/18 158 lb 9.6 oz (71.9 kg)  11/16/11 175 lb (79.4 kg)      Other studies Reviewed: Additional studies/ records that were reviewed today include: TTE 06/10/17  Review of the above records today demonstrates:  - Left ventricle: The cavity size was normal. Systolic function was   normal. The estimated ejection fraction was in the range of 55%   to 60%. Wall motion was normal; there were no regional wall   motion abnormalities. Features are consistent with a pseudonormal   left ventricular filling pattern, with concomitant abnormal   relaxation and increased filling pressure (grade 2 diastolic   dysfunction). - Aortic valve: There was mild regurgitation. Valve area (VTI):   1.72 cm^2. Valve area (Vmax): 1.54 cm^2. Valve area (Vmean): 1.74   cm^2. - Left  atrium: The atrium was moderately dilated. - Right atrium: The atrium was moderately dilated. - Tricuspid valve: There was moderate regurgitation. - Pulmonary arteries: PA peak pressure: 49 mm Hg (S).   ASSESSMENT AND PLAN:  1.  Sick sinus syndrome: Status post Medtronic dual-chamber pacemaker.  Device functioning appropriately.  No changes at this time.  2.  SVT: No episodes of SVT noted on device interrogation.  He has not noted any further tachycardia.  We Timothy Carney continue with current management.  Current medicines are reviewed at length with the patient today.   The patient does not have concerns regarding his medicines.  The following changes were made today:  none  Labs/ tests ordered today include:  No orders of the defined types were placed in  this encounter.    Disposition:   FU with Carron Jaggi 1 year  Signed, Roseland Braun Meredith Leeds, MD  06/24/2018 12:05 PM     [place] [address] [place] [address] [phone] (office) [phone] (fax)

## 2018-06-24 NOTE — Patient Instructions (Addendum)
Medication Instructions:  Your physician recommends that you continue on your current medications as directed. Please refer to the Current Medication list given to you today.  *If you need a refill on your cardiac medications before your next appointment, please call your pharmacy*  Labwork: None ordered  Testing/Procedures: None ordered  Follow-Up: Remote monitoring is used to monitor your Pacemaker or ICD from home. This monitoring reduces the number of office visits required to check your device to one time per year. It allows [place] to keep an eye on the functioning of your device to ensure it is working properly. You are scheduled for a device check from home on 09/23/2018. You may send your transmission at any time that day. If you have a wireless device, the transmission will be sent automatically. After your physician reviews your transmission, you will receive a postcard with your next transmission date.  Your physician wants you to follow-up in: 1 year with Dr. Curt Bears.  You will receive a reminder letter in the mail two months in advance. If you don't receive a letter, please call our office to schedule the follow-up appointment.  Thank you for choosing [facility]!!   Trinidad Curet, RN [phone]  Any Other Special Instructions Will Be Listed Below (If Applicable). Device clinic phone number:  [phone]

## 2018-06-24 NOTE — Telephone Encounter (Signed)
I called the patient again and still no luck in contacting the pt.

## 2018-06-26 NOTE — Telephone Encounter (Signed)
I called to see if the patient has cell signal at his house but I did not get an answer.  I was unable to leave a voicemail.

## 2018-06-26 NOTE — Telephone Encounter (Signed)
-----   Message from Wanda Plump, RN sent at 06/24/2018  1:20 PM EST ----- Regarding: Call pt about cell service for new home monitor. Can one of yall call this pt and see if he gets cell signal at this house and if so manually enroll him in carelink and order him a new monitor, I already got that far and it will let you do that but I dont know if the man has cell service where he lives. He was new pt I seen in [place] today

## 2018-07-01 NOTE — Telephone Encounter (Signed)
Carelink monitor ordered and instructions mailed.

## 2018-07-17 ENCOUNTER — Ambulatory Visit (INDEPENDENT_AMBULATORY_CARE_PROVIDER_SITE_OTHER): Payer: Medicare HMO | Admitting: Cardiology

## 2018-07-17 ENCOUNTER — Encounter: Payer: Self-pay | Admitting: Cardiology

## 2018-07-17 VITALS — BP 124/64 | HR 94 | Ht 69.0 in | Wt 162.6 lb

## 2018-07-17 DIAGNOSIS — Z950 Presence of cardiac pacemaker: Secondary | ICD-10-CM

## 2018-07-17 DIAGNOSIS — I471 Supraventricular tachycardia: Secondary | ICD-10-CM

## 2018-07-17 DIAGNOSIS — I120 Hypertensive chronic kidney disease with stage 5 chronic kidney disease or end stage renal disease: Secondary | ICD-10-CM | POA: Diagnosis not present

## 2018-07-17 DIAGNOSIS — I495 Sick sinus syndrome: Secondary | ICD-10-CM

## 2018-07-17 DIAGNOSIS — N186 End stage renal disease: Secondary | ICD-10-CM

## 2018-07-17 NOTE — Progress Notes (Signed)
Cardiology Office Note:    Date:  07/17/2018   ID:  Timothy Carney, DOB Apr 24, 1926, MRN [id number]  PCP:  [name], MD  Cardiologist:  Jenne Campus, MD    Referring MD: [name], MD   Chief Complaint  Patient presents with  . Follow-up  Doing fair  History of Present Illness:    Timothy Carney is a 85 y.o. male with chronic renal failure on dialysis.  Overall seems to be doing fine described the fact that he is very weak and tired the days of dialysis but then state that he does not have his dialysis he is functioning quite well denies have any chest pain tightness squeezing pressure burning chest.  Complain of having some cramps while on dialysis.  Past Medical History:  Diagnosis Date  . Anemia   . Anxiety    Takes Ativan  . Benign hypertension 01/06/2016  . Blood transfusion 2012  . Chronic kidney disease    FOLLOWED BY DR Moshe Cipro  . Chronic kidney disease, stage 5, kidney failure ([place]) 01/06/2016   managed NEPH 2016: 12 2017: dialysis  . Chronic pain syndrome 05/16/2016   2016: onset 2017: opiates  . Diverticulitis 2012  . Diverticulosis of colon 01/06/2016   2016: diverticulitis  . End stage renal disease ([place]) 07/13/2011  . GERD (gastroesophageal reflux disease)   . Gout 01/06/2016  . Hyperlipidemia, mixed 01/06/2016  . Hypertension    takes Metoprolol but pt states it makes blood pressure drop;   Marland Kitchen Mechanical complication of other vascular device, implant, and graft 09/21/2011  . Pacemaker 07/12/10   Dual Chamber Pacemaker ( Dr. Agustin Cree at [facility])  . Pneumonia April 2012  . PSVT (paroxysmal supraventricular tachycardia) ([place]) 01/06/2016  . Secondary hyperparathyroidism ([place])   . Sick sinus syndrome ([place]) 01/06/2016   pacemaker    Past Surgical History:  Procedure Laterality Date  . AV FISTULA PLACEMENT  07/17/2011   Procedure: ARTERIOVENOUS (AV) FISTULA CREATION;  Surgeon: Elam Dutch, MD;  Location: [facility] OR;  Service: Vascular;   Laterality: Right;  . AV FISTULA PLACEMENT  10/04/2011   Procedure: ARTERIOVENOUS (AV) FISTULA CREATION;  Surgeon: Elam Dutch, MD;  Location: [place];  Service: Vascular;;  . Lillard Anes    . HERNIA REPAIR    . INSERT / REPLACE / REMOVE PACEMAKER  07/2010  . PROSTATE SURGERY      Current Medications: Current Meds  Medication Sig  . allopurinol (ZYLOPRIM) 100 MG tablet Take 2 tablets by mouth daily.  Marland Kitchen aspirin 81 MG tablet Take 81 mg by mouth daily.   . cinacalcet (SENSIPAR) 30 MG tablet TAKE 1 TABLET BY MOUTH DAILY WITH FOOD (DO NOT TAKE LESS THAN 12 HOURS PRIOR TO DIALYSIS)  . Cyanocobalamin (VITAMIN B-12 CR PO) Take 1,000 mcg by mouth daily.   Marland Kitchen NITROSTAT 0.4 MG SL tablet Place 0.4 mg under the tongue every 5 (five) minutes as needed. For chest pain  . ondansetron (ZOFRAN) 4 MG tablet Take 1 tablet by mouth as needed for nausea.  . sevelamer carbonate (RENVELA) 800 MG tablet Take 1 tablet by mouth daily.  . tamsulosin (FLOMAX) 0.4 MG CAPS capsule Take 1 capsule by mouth 2 (two) times daily.  . vitamin E 400 UNIT capsule Take 400 Units by mouth daily.     Allergies:   Patient has no known allergies.   Social History   Socioeconomic History  . Marital status: Married    Spouse name: Not on  file  . Number of children: Not on file  . Years of education: Not on file  . Highest education level: Not on file  Occupational History  . Not on file  Social Needs  . Financial resource strain: Not on file  . Food insecurity:    Worry: Not on file    Inability: Not on file  . Transportation needs:    Medical: Not on file    Non-medical: Not on file  Tobacco Use  . Smoking status: Former Smoker    Packs/day: 1.50    Years: 20.00    Pack years: 30.00    Types: Cigarettes    Last attempt to quit: 11/16/1959    Years since quitting: 58.7  . Smokeless tobacco: Never Used  Substance and Sexual Activity  . Alcohol use: No  . Drug use: No  . Sexual activity: Not on file  Lifestyle   . Physical activity:    Days per week: Not on file    Minutes per session: Not on file  . Stress: Not on file  Relationships  . Social connections:    Talks on phone: Not on file    Gets together: Not on file    Attends religious service: Not on file    Active member of club or organization: Not on file    Attends meetings of clubs or organizations: Not on file    Relationship status: Not on file  Other Topics Concern  . Not on file  Social History Narrative  . Not on file     Family History: The patient's family history includes Diabetes in his sister; Heart disease in his mother; Hypertension in his brother, daughter, father, sister, and son; Rheum arthritis in his mother and sister; Stroke in his father. There is no history of Anesthesia problems, Hypotension, Malignant hyperthermia, or Pseudochol deficiency. ROS:   Please see the history of present illness.    All 14 point review of systems negative except as described per history of present illness  EKGs/Labs/Other Studies Reviewed:      Recent Labs: No results found for requested labs within last 8760 hours.  Recent Lipid Panel No results found for: CHOL, TRIG, HDL, CHOLHDL, VLDL, LDLCALC, LDLDIRECT  Physical Exam:    VS:  BP 124/64   Pulse 94   Ht 5\' 9"  (1.753 m)   Wt 162 lb 9.6 oz (73.8 kg)   SpO2 96%   BMI 24.01 kg/m     Wt Readings from Last 3 Encounters:  07/17/18 162 lb 9.6 oz (73.8 kg)  06/24/18 158 lb (71.7 kg)  04/15/18 158 lb 9.6 oz (71.9 kg)     GEN:  Well nourished, well developed in no acute distress HEENT: Normal NECK: No JVD; No carotid bruits LYMPHATICS: No lymphadenopathy CARDIAC: RRR, no murmurs, no rubs, no gallops RESPIRATORY:  Clear to auscultation without rales, wheezing or rhonchi  ABDOMEN: Soft, non-tender, non-distended MUSCULOSKELETAL:  No edema; No deformity  SKIN: Warm and dry LOWER EXTREMITIES: no swelling NEUROLOGIC:  Alert and oriented x 3 PSYCHIATRIC:  Normal affect     ASSESSMENT:    1. PSVT (paroxysmal supraventricular tachycardia) ([place])   2. Sick sinus syndrome ([place])   3. Hypertensive kidney disease with ESRD (end-stage renal disease) ([place])   4. Pacemaker    PLAN:    In order of problems listed above:  1. SVT rare episodes.  We will continue present management. 2. Sick sinus syndrome that being addressed with pacemaker. 3. Hypertension  blood pressure well controlled we will continue present management. 4. Pacemaker recent interrogation done by EP team normal function.   Medication Adjustments/Labs and Tests Ordered: Current medicines are reviewed at length with the patient today.  Concerns regarding medicines are outlined above.  No orders of the defined types were placed in this encounter.  Medication changes: No orders of the defined types were placed in this encounter.   Signed, Park Liter, MD, [facility] 07/17/2018 [facility]

## 2018-07-17 NOTE — Patient Instructions (Signed)
Medication Instructions:  Your physician recommends that you continue on your current medications as directed. Please refer to the Current Medication list given to you today.  If you need a refill on your cardiac medications before your next appointment, please call your pharmacy.   Lab work: None.  If you have labs (blood work) drawn today and your tests are completely normal, you will receive your results only by: Marland Kitchen MyChart Message (if you have MyChart) OR . A paper copy in the mail If you have any lab test that is abnormal or we need to change your treatment, we will call you to review the results.  Testing/Procedures: None.    Follow-Up: At [facility], you and your health needs are our priority.  As part of our continuing mission to provide you with exceptional heart care, we have created designated Provider Care Teams.  These Care Teams include your primary Cardiologist (physician) and Advanced Practice Providers (APPs -  Physician Assistants and Nurse Practitioners) who all work together to provide you with the care you need, when you need it. You will need a follow up appointment in 5 months.  Please call our office 2 months in advance to schedule this appointment.  You may see Jenne Campus, MD or another member of our [place] Provider Team in [place]: Shirlee More, MD . Jyl Heinz, MD  Any Other Special Instructions Will Be Listed Below (If Applicable).

## 2018-09-23 ENCOUNTER — Encounter: Payer: Medicare HMO | Admitting: *Deleted

## 2018-09-23 ENCOUNTER — Other Ambulatory Visit: Payer: Self-pay

## 2018-09-24 ENCOUNTER — Telehealth: Payer: Self-pay

## 2018-09-24 NOTE — Telephone Encounter (Signed)
Unable to speak  with patient to remind of missed remote transmission 

## 2018-10-01 ENCOUNTER — Encounter: Payer: Self-pay | Admitting: Cardiology

## 2018-10-31 ENCOUNTER — Ambulatory Visit (INDEPENDENT_AMBULATORY_CARE_PROVIDER_SITE_OTHER): Payer: Medicare HMO | Admitting: Cardiology

## 2018-10-31 ENCOUNTER — Other Ambulatory Visit: Payer: Self-pay

## 2018-10-31 ENCOUNTER — Encounter: Payer: Self-pay | Admitting: Cardiology

## 2018-10-31 VITALS — Temp 98.1°F | Ht 69.0 in | Wt 162.2 lb

## 2018-10-31 DIAGNOSIS — I495 Sick sinus syndrome: Secondary | ICD-10-CM

## 2018-10-31 DIAGNOSIS — Z950 Presence of cardiac pacemaker: Secondary | ICD-10-CM

## 2018-10-31 NOTE — Progress Notes (Signed)
Cardiology Office Note:    Date:  10/31/2018   ID:  Timothy Carney, DOB 06/13/1925, MRN [id number]  PCP:  [name], MD  Cardiologist:  Jenean Lindau, MD   Referring MD: [name], MD    ASSESSMENT:    1. Sick sinus syndrome ([place])   2. Pacemaker    PLAN:    In order of problems listed above:  1. As mentioned above the patient was referred here for EKG.  He is asymptomatic and in no distress at this time of my evaluation.  His ECG revealed atrial pacing and ventricular sensing.  The EKG is scanned under the EKG section for review.  The results of the EKG was discussed with the patient.   Medication Adjustments/Labs and Tests Ordered: Current medicines are reviewed at length with the patient today.  Concerns regarding medicines are outlined above.  No orders of the defined types were placed in this encounter.  No orders of the defined types were placed in this encounter.    No chief complaint on file.    History of Present Illness:    Timothy Carney is a 85 y.o. male.  Patient has had sick sinus syndrome and pacemaker inserted.  At dialysis he was told that he might be bradycardic so he was sent in here by my partner Dr. Agustin Cree for an EKG.  Patient denies any symptoms and is alert awake and oriented and in no distress  Past Medical History:  Diagnosis Date  . Anemia   . Anxiety    Takes Ativan  . Benign hypertension 01/06/2016  . Blood transfusion 2012  . Chronic kidney disease    FOLLOWED BY DR Moshe Cipro  . Chronic kidney disease, stage 5, kidney failure ([place]) 01/06/2016   managed NEPH 2016: 12 2017: dialysis  . Chronic pain syndrome 05/16/2016   2016: onset 2017: opiates  . Diverticulitis 2012  . Diverticulosis of colon 01/06/2016   2016: diverticulitis  . End stage renal disease ([place]) 07/13/2011  . GERD (gastroesophageal reflux disease)   . Gout 01/06/2016  . Hyperlipidemia, mixed 01/06/2016  . Hypertension    takes Metoprolol but pt states it  makes blood pressure drop;   Marland Kitchen Mechanical complication of other vascular device, implant, and graft 09/21/2011  . Pacemaker 07/12/10   Dual Chamber Pacemaker ( Dr[facility] Agustin Cree at [facility])  . Pneumonia April 2012  . PSVT (paroxysmal supraventricular tachycardia) ([place]) 01/06/2016  . Secondary hyperparathyroidism ([place])   . Sick sinus syndrome ([place]) 01/06/2016   pacemaker    Past Surgical History:  Procedure Laterality Date  . AV FISTULA PLACEMENT  07/17/2011   Procedure: ARTERIOVENOUS (AV) FISTULA CREATION;  Surgeon: Elam Dutch, MD;  Location: [facility] OR;  Service: Vascular;  Laterality: Right;  . AV FISTULA PLACEMENT  10/04/2011   Procedure: ARTERIOVENOUS (AV) FISTULA CREATION;  Surgeon: Elam Dutch, MD;  Location: [place];  Service: Vascular;;  . Lillard Anes    . HERNIA REPAIR    . INSERT / REPLACE / REMOVE PACEMAKER  07/2010  . PROSTATE SURGERY      Current Medications: No outpatient medications have been marked as taking for the 10/31/18 encounter (Office Visit) with Apolinar Bero, Reita Cliche, MD.     Allergies:   Patient has no known allergies.   Social History   Socioeconomic History  . Marital status: Married    Spouse name: Not on file  . Number of children: Not on file  . Years of education:  Not on file  . Highest education level: Not on file  Occupational History  . Not on file  Social Needs  . Financial resource strain: Not on file  . Food insecurity:    Worry: Not on file    Inability: Not on file  . Transportation needs:    Medical: Not on file    Non-medical: Not on file  Tobacco Use  . Smoking status: Former Smoker    Packs/day: 1.50    Years: 20.00    Pack years: 30.00    Types: Cigarettes    Last attempt to quit: 11/16/1959    Years since quitting: 58.9  . Smokeless tobacco: Never Used  Substance and Sexual Activity  . Alcohol use: No  . Drug use: No  . Sexual activity: Not on file  Lifestyle  . Physical activity:    Days per week: Not on file     Minutes per session: Not on file  . Stress: Not on file  Relationships  . Social connections:    Talks on phone: Not on file    Gets together: Not on file    Attends religious service: Not on file    Active member of club or organization: Not on file    Attends meetings of clubs or organizations: Not on file    Relationship status: Not on file  Other Topics Concern  . Not on file  Social History Narrative  . Not on file     Family History: The patient's family history includes Diabetes in his sister; Heart disease in his mother; Hypertension in his brother, daughter, father, sister, and son; Rheum arthritis in his mother and sister; Stroke in his father. There is no history of Anesthesia problems, Hypotension, Malignant hyperthermia, or Pseudochol deficiency.  ROS:   Please see the history of present illness.    All other systems reviewed and are negative.  EKGs/Labs/Other Studies Reviewed:    The following studies were reviewed today: EKG done revealed paced rhythm.  Atrial pacing with ventricular sensing.  EKG appeared unremarkable.   Recent Labs: No results found for requested labs within last 8760 hours.  Recent Lipid Panel No results found for: CHOL, TRIG, HDL, CHOLHDL, VLDL, LDLCALC, LDLDIRECT  Physical Exam:    VS:  Temp 98.1 F (36.7 C) (Temporal)   Ht 5\' 9"  (1.753 m)   Wt 162 lb 3.2 oz (73.6 kg)   BMI 23.95 kg/m     Wt Readings from Last 3 Encounters:  10/31/18 162 lb 3.2 oz (73.6 kg)  07/17/18 162 lb 9.6 oz (73.8 kg)  06/24/18 158 lb (71.7 kg)     GEN: Patient is in no acute distress HEENT: Normal NEUROLOGIC:  Alert and oriented x 3 PSYCHIATRIC:  Normal affect   Signed, Jenean Lindau, MD  10/31/2018 2:16 PM    [facility]

## 2018-11-25 ENCOUNTER — Telehealth: Payer: Self-pay | Admitting: *Deleted

## 2018-11-25 ENCOUNTER — Ambulatory Visit (INDEPENDENT_AMBULATORY_CARE_PROVIDER_SITE_OTHER): Payer: Medicare HMO | Admitting: *Deleted

## 2018-11-25 DIAGNOSIS — I495 Sick sinus syndrome: Secondary | ICD-10-CM

## 2018-11-25 DIAGNOSIS — I471 Supraventricular tachycardia: Secondary | ICD-10-CM

## 2018-11-25 NOTE — Telephone Encounter (Signed)
Forwarding to device clinic to address (pt No Show to last transmission scheduled for 4/20,)

## 2018-11-25 NOTE — Telephone Encounter (Signed)
Confirmed that remote transmission received. Pt reports he wanted to make sure that clinic received transmission. No medical concerns or problems reported at time of call.

## 2018-11-25 NOTE — Telephone Encounter (Signed)
Pt wants to know if Dr. Curt Bears is getting his pace maker readings. Please advise.

## 2018-11-26 LAB — CUP PACEART REMOTE DEVICE CHECK
Battery Voltage: 2.89 V
Brady Statistic AP VP Percent: 2.72 %
Brady Statistic AP VS Percent: 65.43 %
Brady Statistic AS VP Percent: 0.26 %
Brady Statistic AS VS Percent: 31.6 %
Brady Statistic RA Percent Paced: 62.9 %
Brady Statistic RV Percent Paced: 3.25 %
Date Time Interrogation Session: 20200622174536
Implantable Lead Implant Date: 20120207
Implantable Lead Implant Date: 20120207
Implantable Lead Location: 753859
Implantable Lead Location: 753860
Implantable Pulse Generator Implant Date: 20120207
Lead Channel Impedance Value: 348 Ohm
Lead Channel Impedance Value: 432 Ohm
Lead Channel Sensing Intrinsic Amplitude: 1.232 mV
Lead Channel Setting Pacing Amplitude: 2 V
Lead Channel Setting Pacing Amplitude: 2.5 V
Lead Channel Setting Pacing Pulse Width: 0.4 ms
Lead Channel Setting Sensing Sensitivity: 0.9 mV

## 2018-12-04 NOTE — Progress Notes (Signed)
Remote pacemaker transmission.   

## 2019-02-25 ENCOUNTER — Encounter: Payer: Medicare HMO | Admitting: *Deleted

## 2019-03-05 ENCOUNTER — Ambulatory Visit (INDEPENDENT_AMBULATORY_CARE_PROVIDER_SITE_OTHER): Payer: Medicare HMO | Admitting: *Deleted

## 2019-03-05 DIAGNOSIS — I495 Sick sinus syndrome: Secondary | ICD-10-CM | POA: Diagnosis not present

## 2019-03-06 LAB — CUP PACEART REMOTE DEVICE CHECK
Battery Voltage: 2.88 V
Brady Statistic AP VP Percent: 3.28 %
Brady Statistic AP VS Percent: 68.81 %
Brady Statistic AS VP Percent: 0.21 %
Brady Statistic AS VS Percent: 27.7 %
Brady Statistic RA Percent Paced: 66.28 %
Brady Statistic RV Percent Paced: 3.8 %
Date Time Interrogation Session: 20200930181301
Implantable Lead Implant Date: 20120207
Implantable Lead Implant Date: 20120207
Implantable Lead Location: 753859
Implantable Lead Location: 753860
Implantable Pulse Generator Implant Date: 20120207
Lead Channel Impedance Value: 368 Ohm
Lead Channel Impedance Value: 480 Ohm
Lead Channel Sensing Intrinsic Amplitude: 1.584 mV
Lead Channel Setting Pacing Amplitude: 2 V
Lead Channel Setting Pacing Amplitude: 2.5 V
Lead Channel Setting Pacing Pulse Width: 0.4 ms
Lead Channel Setting Sensing Sensitivity: 0.9 mV

## 2019-03-11 ENCOUNTER — Encounter: Payer: Self-pay | Admitting: Cardiology

## 2019-03-11 NOTE — Progress Notes (Signed)
Remote pacemaker transmission.   

## 2019-03-19 ENCOUNTER — Telehealth: Payer: Self-pay | Admitting: *Deleted

## 2019-03-19 MED ORDER — METOPROLOL TARTRATE 25 MG PO TABS: 25.0000 mg | ORAL_TABLET | Freq: Two times a day (BID) | ORAL | 6 refills | Status: DC

## 2019-03-19 NOTE — Telephone Encounter (Signed)
Informed patient of results and verbal understanding expressed. Advised to call office back if SE begin after starting.. Patient verbalized understanding and agreeable to plan.

## 2019-03-19 NOTE — Telephone Encounter (Signed)
-----   Message from Will Meredith Leeds, MD sent at 03/06/2019 11:33 AM EDT ----- Abnormal device interrogation reviewed.  Lead parameters and battery status stable.  NSVT noted. Start metoprolol 25 mg BID

## 2019-04-21 ENCOUNTER — Other Ambulatory Visit: Payer: Self-pay

## 2019-04-21 ENCOUNTER — Encounter: Payer: Self-pay | Admitting: Cardiology

## 2019-04-21 ENCOUNTER — Ambulatory Visit (INDEPENDENT_AMBULATORY_CARE_PROVIDER_SITE_OTHER): Payer: Medicare HMO | Admitting: Cardiology

## 2019-04-21 VITALS — BP 120/52 | HR 82 | Resp 12 | Ht 69.0 in | Wt 167.0 lb

## 2019-04-21 DIAGNOSIS — I495 Sick sinus syndrome: Secondary | ICD-10-CM | POA: Diagnosis not present

## 2019-04-21 DIAGNOSIS — N186 End stage renal disease: Secondary | ICD-10-CM

## 2019-04-21 DIAGNOSIS — I120 Hypertensive chronic kidney disease with stage 5 chronic kidney disease or end stage renal disease: Secondary | ICD-10-CM

## 2019-04-21 DIAGNOSIS — G894 Chronic pain syndrome: Secondary | ICD-10-CM

## 2019-04-21 DIAGNOSIS — I472 Ventricular tachycardia: Secondary | ICD-10-CM | POA: Insufficient documentation

## 2019-04-21 DIAGNOSIS — Z950 Presence of cardiac pacemaker: Secondary | ICD-10-CM

## 2019-04-21 DIAGNOSIS — I4729 Other ventricular tachycardia: Secondary | ICD-10-CM | POA: Insufficient documentation

## 2019-04-21 DIAGNOSIS — I471 Supraventricular tachycardia: Secondary | ICD-10-CM

## 2019-04-21 NOTE — Addendum Note (Signed)
Addended by: Ashok Norris on: 04/21/2019 09:52 AM   Modules accepted: Orders

## 2019-04-21 NOTE — Progress Notes (Signed)
Cardiology Office Note:    Date:  04/21/2019   ID:  Timothy Carney, DOB May 21, 1926, MRN ID:[id number]  PCP:  Timothy Greenhouse, MD  Cardiologist:  Timothy Campus, MD    Referring MD: Timothy Greenhouse, MD   I am weak and tired  History of Present Illness:    Timothy Carney is a 85 y.o. male with tachybradycardia syndrome status post pacemaker implantation which is a Medtronic device, also chronic kidney failure on dialysis, essential hypertension comes today to my office for follow-up.  Recent interrogation of his device showed presence of nonsustained ventricular tachycardia.  He is asymptomatic, no passing out however he does have some dizziness especially when he gets up very quickly.  He denies having any chest pain, tightness, pressure, burning in the chest feels very weak and tired during dialysis and sometimes he does have some sensation on the left side of his chest during dialysis.  He was given metoprolol 25 twice daily however was not able to tolerate it because of low blood pressure during dialysis and he was asked not to take this medication anymore.  Past Medical History:  Diagnosis Date  . Anemia   . Anxiety    Takes Ativan  . Benign hypertension 01/06/2016  . Blood transfusion 2012  . Chronic kidney disease    FOLLOWED BY DR Timothy Carney  . Chronic kidney disease, stage 5, kidney failure ([place]) 01/06/2016   managed NEPH 2016: 12 2017: dialysis  . Chronic pain syndrome 05/16/2016   2016: onset 2017: opiates  . Diverticulitis 2012  . Diverticulosis of colon 01/06/2016   2016: diverticulitis  . End stage renal disease ([place]) 07/13/2011  . GERD (gastroesophageal reflux disease)   . Gout 01/06/2016  . Hyperlipidemia, mixed 01/06/2016  . Hypertension    takes Metoprolol but pt states it makes blood pressure drop;   Marland Kitchen Mechanical complication of other vascular device, implant, and graft 09/21/2011  . Pacemaker 07/12/10   Dual Chamber Pacemaker ( Dr. Agustin Carney at [facility])  .  Pneumonia April 2012  . PSVT (paroxysmal supraventricular tachycardia) ([place]) 01/06/2016  . Secondary hyperparathyroidism ([place])   . Sick sinus syndrome ([place]) 01/06/2016   pacemaker    Past Surgical History:  Procedure Laterality Date  . AV FISTULA PLACEMENT  07/17/2011   Procedure: ARTERIOVENOUS (AV) FISTULA CREATION;  Surgeon: Timothy Dutch, MD;  Location: [facility] OR;  Service: Vascular;  Laterality: Right;  . AV FISTULA PLACEMENT  10/04/2011   Procedure: ARTERIOVENOUS (AV) FISTULA CREATION;  Surgeon: Timothy Dutch, MD;  Location: [place];  Service: Vascular;;  . Timothy Carney    . HERNIA REPAIR    . INSERT / REPLACE / REMOVE PACEMAKER  07/2010  . PROSTATE SURGERY      Current Medications: Current Meds  Medication Sig  . allopurinol (ZYLOPRIM) 100 MG tablet Take 2 tablets by mouth daily.  Marland Kitchen aspirin 81 MG tablet Take 81 mg by mouth daily.   . cinacalcet (SENSIPAR) 30 MG tablet TAKE 1 TABLET BY MOUTH DAILY WITH FOOD (DO NOT TAKE LESS THAN 12 HOURS PRIOR TO DIALYSIS)  . Cyanocobalamin (VITAMIN B-12 CR PO) Take 1,000 mcg by mouth daily.   . metoprolol tartrate (LOPRESSOR) 25 MG tablet Take 1 tablet (25 mg total) by mouth 2 (two) times daily.  Marland Kitchen NITROSTAT 0.4 MG SL tablet Place 0.4 mg under the tongue every 5 (five) minutes as needed. For chest pain  . ondansetron (ZOFRAN) 4 MG tablet Take 1 tablet by mouth as  needed for nausea.  . sevelamer carbonate (RENVELA) 800 MG tablet Take 1 tablet by mouth daily.  . tamsulosin (FLOMAX) 0.4 MG CAPS capsule Take 1 capsule by mouth 2 (two) times daily.  . vitamin E 400 UNIT capsule Take 400 Units by mouth daily.     Allergies:   Patient has no known allergies.   Social History   Socioeconomic History  . Marital status: Married    Spouse name: Not on file  . Number of children: Not on file  . Years of education: Not on file  . Highest education level: Not on file  Occupational History  . Not on file  Social Needs  . Financial resource strain: Not  on file  . Food insecurity    Worry: Not on file    Inability: Not on file  . Transportation needs    Medical: Not on file    Non-medical: Not on file  Tobacco Use  . Smoking status: Former Smoker    Packs/day: 1.50    Years: 20.00    Pack years: 30.00    Types: Cigarettes    Quit date: 11/16/1959    Years since quitting: 59.4  . Smokeless tobacco: Never Used  Substance and Sexual Activity  . Alcohol use: No  . Drug use: No  . Sexual activity: Not on file  Lifestyle  . Physical activity    Days per week: Not on file    Minutes per session: Not on file  . Stress: Not on file  Relationships  . Social Herbalist on phone: Not on file    Gets together: Not on file    Attends religious service: Not on file    Active member of club or organization: Not on file    Attends meetings of clubs or organizations: Not on file    Relationship status: Not on file  Other Topics Concern  . Not on file  Social History Narrative  . Not on file     Family History: The patient's family history includes Diabetes in his sister; Heart disease in his mother; Hypertension in his brother, daughter, father, sister, and son; Rheum arthritis in his mother and sister; Stroke in his father. There is no history of Anesthesia problems, Hypotension, Malignant hyperthermia, or Pseudochol deficiency. ROS:   Please see the history of present illness.    All 14 point review of systems negative except as described per history of present illness  EKGs/Labs/Other Studies Reviewed:      Recent Labs: No results found for requested labs within last 8760 hours.  Recent Lipid Panel No results found for: CHOL, TRIG, HDL, CHOLHDL, VLDL, LDLCALC, LDLDIRECT  Physical Exam:    VS:  BP (!) 120/52   Pulse 82   Resp 12   Ht 5\' 9"  (1.753 m)   Wt 167 lb (75.8 kg)   BMI 24.66 kg/m     Wt Readings from Last 3 Encounters:  04/21/19 167 lb (75.8 kg)  10/31/18 162 lb 3.2 oz (73.6 kg)  07/17/18 162 lb  9.6 oz (73.8 kg)     GEN:  Well nourished, well developed in no acute distress HEENT: Normal NECK: No JVD; No carotid bruits LYMPHATICS: No lymphadenopathy CARDIAC: RRR, no murmurs, no rubs, no gallops RESPIRATORY:  Clear to auscultation without rales, wheezing or rhonchi  ABDOMEN: Soft, non-tender, non-distended MUSCULOSKELETAL:  No edema; No deformity  SKIN: Warm and dry LOWER EXTREMITIES: no swelling NEUROLOGIC:  Alert and oriented x  3 PSYCHIATRIC:  Normal affect   ASSESSMENT:    1. Sick sinus syndrome ([place])   2. Nonsustained ventricular tachycardia ([place])   3. PSVT (paroxysmal supraventricular tachycardia) ([place])   4. Hypertensive kidney disease with ESRD (end-stage renal disease) ([place])   5. Pacemaker   6. Chronic pain syndrome    PLAN:    In order of problems listed above:  1. Nonsustained ventricular tachycardia discovered on interrogation of his device.  Obviously very concerning phenomenon.  I will ask him to have echocardiogram to stratify this arrhythmia.  I also asked him to lower his metoprolol from 25 twice daily he took only 2 dosages and after that he got dialysis with low blood pressure and he was told not to take it anymore, therefore I asked him to lower the dose to only 12.5 twice daily and asked him not to take metoprolol the day of dialysis.  Again we will try to stratify this arrhythmia by getting echocardiogram. 2. Sick sinus syndrome that being addressed with a pacemaker. 3. Paroxysmal supraventricular tachycardia asymptomatic. 4. Pacemaker present is a Medtronic device continue present management. 5. Hypertensive heart disease doing well from that point review actually the problem we have is a low blood pressure during dialysis.  See him back in my office in 3 months or sooner if he has a problem   Medication Adjustments/Labs and Tests Ordered: Current medicines are reviewed at length with the patient today.  Concerns regarding medicines are outlined  above.  No orders of the defined types were placed in this encounter.  Medication changes: No orders of the defined types were placed in this encounter.   Signed, Park Liter, MD, [facility] 04/21/2019 9:48 AM    [place]

## 2019-04-21 NOTE — Patient Instructions (Signed)
Medication Instructions:  Your physician recommends that you continue on your current medications as directed. Please refer to the Current Medication list given to you today.  *If you need a refill on your cardiac medications before your next appointment, please call your pharmacy*  Lab Work: None.  If you have labs (blood work) drawn today and your tests are completely normal, you will receive your results only by: . MyChart Message (if you have MyChart) OR . A paper copy in the mail If you have any lab test that is abnormal or we need to change your treatment, we will call you to review the results.  Testing/Procedures: Your physician has requested that you have an echocardiogram. Echocardiography is a painless test that uses sound waves to create images of your heart. It provides your doctor with information about the size and shape of your heart and how well your heart's chambers and valves are working. This procedure takes approximately one hour. There are no restrictions for this procedure.    Follow-Up: At [facility], you and your health needs are our priority.  As part of our continuing mission to provide you with exceptional heart care, we have created designated Provider Care Teams.  These Care Teams include your primary Cardiologist (physician) and Advanced Practice Providers (APPs -  Physician Assistants and Nurse Practitioners) who all work together to provide you with the care you need, when you need it.  Your next appointment:   3 month(s)  The format for your next appointment:   In Person  Provider:   Robert Krasowski, MD  Other Instructions   Echocardiogram An echocardiogram is a procedure that uses painless sound waves (ultrasound) to produce an image of the heart. Images from an echocardiogram can provide important information about:  Signs of coronary artery disease (CAD).  Aneurysm detection. An aneurysm is a weak or damaged part of an artery wall that  bulges out from the normal force of blood pumping through the body.  Heart size and shape. Changes in the size or shape of the heart can be associated with certain conditions, including heart failure, aneurysm, and CAD.  Heart muscle function.  Heart valve function.  Signs of a past heart attack.  Fluid buildup around the heart.  Thickening of the heart muscle.  A tumor or infectious growth around the heart valves. Tell a health care provider about:  Any allergies you have.  All medicines you are taking, including vitamins, herbs, eye drops, creams, and over-the-counter medicines.  Any blood disorders you have.  Any surgeries you have had.  Any medical conditions you have.  Whether you are pregnant or may be pregnant. What are the risks? Generally, this is a safe procedure. However, problems may occur, including:  Allergic reaction to dye (contrast) that may be used during the procedure. What happens before the procedure? No specific preparation is needed. You may eat and drink normally. What happens during the procedure?   An IV tube may be inserted into one of your veins.  You may receive contrast through this tube. A contrast is an injection that improves the quality of the pictures from your heart.  A gel will be applied to your chest.  A wand-like tool (transducer) will be moved over your chest. The gel will help to transmit the sound waves from the transducer.  The sound waves will harmlessly bounce off of your heart to allow the heart images to be captured in real-time motion. The images will be recorded   on a computer. The procedure may vary among health care providers and hospitals. What happens after the procedure?  You may return to your normal, everyday life, including diet, activities, and medicines, unless your health care provider tells you not to do that. Summary  An echocardiogram is a procedure that uses painless sound waves (ultrasound) to produce  an image of the heart.  Images from an echocardiogram can provide important information about the size and shape of your heart, heart muscle function, heart valve function, and fluid buildup around your heart.  You do not need to do anything to prepare before this procedure. You may eat and drink normally.  After the echocardiogram is completed, you may return to your normal, everyday life, unless your health care provider tells you not to do that. This information is not intended to replace advice given to you by your health care provider. Make sure you discuss any questions you have with your health care provider. Document Released: 05/19/2000 Document Revised: 09/12/2018 Document Reviewed: 06/24/2016 Elsevier Patient Education  2020 Elsevier Inc.   

## 2019-04-23 ENCOUNTER — Emergency Department (HOSPITAL_COMMUNITY): Payer: Medicare HMO

## 2019-04-23 ENCOUNTER — Inpatient Hospital Stay (HOSPITAL_COMMUNITY)
Admission: EM | Admit: 2019-04-23 | Discharge: 2019-05-01 | DRG: 177 | Disposition: A | Discharge status: Home Health | Payer: Medicare HMO | Source: Other Acute Inpatient Hospital | Attending: Internal Medicine | Admitting: Internal Medicine

## 2019-04-23 DIAGNOSIS — Z8249 Family history of ischemic heart disease and other diseases of the circulatory system: Secondary | ICD-10-CM

## 2019-04-23 DIAGNOSIS — Z87891 Personal history of nicotine dependence: Secondary | ICD-10-CM

## 2019-04-23 DIAGNOSIS — Z823 Family history of stroke: Secondary | ICD-10-CM

## 2019-04-23 DIAGNOSIS — Z950 Presence of cardiac pacemaker: Secondary | ICD-10-CM

## 2019-04-23 DIAGNOSIS — N400 Enlarged prostate without lower urinary tract symptoms: Secondary | ICD-10-CM | POA: Diagnosis present

## 2019-04-23 DIAGNOSIS — Z79899 Other long term (current) drug therapy: Secondary | ICD-10-CM

## 2019-04-23 DIAGNOSIS — M109 Gout, unspecified: Secondary | ICD-10-CM | POA: Diagnosis present

## 2019-04-23 DIAGNOSIS — N2581 Secondary hyperparathyroidism of renal origin: Secondary | ICD-10-CM | POA: Diagnosis present

## 2019-04-23 DIAGNOSIS — J9601 Acute respiratory failure with hypoxia: Secondary | ICD-10-CM | POA: Diagnosis present

## 2019-04-23 DIAGNOSIS — U071 COVID-19: Principal | ICD-10-CM | POA: Diagnosis present

## 2019-04-23 DIAGNOSIS — Z8261 Family history of arthritis: Secondary | ICD-10-CM

## 2019-04-23 DIAGNOSIS — R7401 Elevation of levels of liver transaminase levels: Secondary | ICD-10-CM | POA: Diagnosis present

## 2019-04-23 DIAGNOSIS — D631 Anemia in chronic kidney disease: Secondary | ICD-10-CM | POA: Diagnosis present

## 2019-04-23 DIAGNOSIS — E8889 Other specified metabolic disorders: Secondary | ICD-10-CM | POA: Diagnosis present

## 2019-04-23 DIAGNOSIS — Z7982 Long term (current) use of aspirin: Secondary | ICD-10-CM

## 2019-04-23 DIAGNOSIS — J1289 Other viral pneumonia: Secondary | ICD-10-CM | POA: Diagnosis present

## 2019-04-23 DIAGNOSIS — R0602 Shortness of breath: Secondary | ICD-10-CM | POA: Diagnosis not present

## 2019-04-23 DIAGNOSIS — Z992 Dependence on renal dialysis: Secondary | ICD-10-CM

## 2019-04-23 DIAGNOSIS — Z20822 Contact with and (suspected) exposure to covid-19: Secondary | ICD-10-CM

## 2019-04-23 DIAGNOSIS — I495 Sick sinus syndrome: Secondary | ICD-10-CM | POA: Diagnosis present

## 2019-04-23 DIAGNOSIS — Z833 Family history of diabetes mellitus: Secondary | ICD-10-CM

## 2019-04-23 DIAGNOSIS — D696 Thrombocytopenia, unspecified: Secondary | ICD-10-CM | POA: Diagnosis present

## 2019-04-23 DIAGNOSIS — I120 Hypertensive chronic kidney disease with stage 5 chronic kidney disease or end stage renal disease: Secondary | ICD-10-CM | POA: Diagnosis present

## 2019-04-23 DIAGNOSIS — N186 End stage renal disease: Secondary | ICD-10-CM | POA: Diagnosis present

## 2019-04-23 MED ORDER — METHYLPREDNISOLONE SODIUM SUCC 125 MG IJ SOLR
125.0000 mg | Freq: Once | INTRAMUSCULAR | Status: AC
  Administered 2019-04-24: 125 mg via INTRAVENOUS
  Filled 2019-04-23: qty 2

## 2019-04-23 MED ORDER — ALBUTEROL SULFATE HFA 108 (90 BASE) MCG/ACT IN AERS
8.0000 | INHALATION_SPRAY | Freq: Once | RESPIRATORY_TRACT | Status: AC
  Administered 2019-04-24: 8 via RESPIRATORY_TRACT
  Filled 2019-04-23: qty 6.7

## 2019-04-23 MED ORDER — VANCOMYCIN HCL IN DEXTROSE 750-5 MG/150ML-% IV SOLN: 750.0000 mg | INTRAVENOUS | Status: DC

## 2019-04-23 MED ORDER — VANCOMYCIN HCL 10 G IV SOLR
1750.0000 mg | Freq: Once | INTRAVENOUS | Status: AC
  Administered 2019-04-24: 1750 mg via INTRAVENOUS
  Filled 2019-04-23: qty 1750

## 2019-04-23 MED ORDER — SODIUM CHLORIDE 0.9 % IV SOLN
1.0000 g | INTRAVENOUS | Status: DC
  Administered 2019-04-24: 1 g via INTRAVENOUS
  Filled 2019-04-23: qty 1

## 2019-04-23 NOTE — ED Triage Notes (Signed)
BIB EMS from dialysis. Received all but last 30 mins of treatment. C/O dry cough, N/V, weakness x 3 days. Temp 99.4 prior to dialysis treatment. Tylenol given at 1730.

## 2019-04-23 NOTE — ED Provider Notes (Signed)
[facility] EMERGENCY DEPARTMENT Provider Note   CSN: [id number] Arrival date & time: 04/23/19  2212     History   Chief Complaint Chief Complaint  Patient presents with   Weakness    HPI Timothy Carney is a 85 y.o. male.     The history is provided by the patient and medical records.  Weakness Associated symptoms: cough, fever and shortness of breath     85 y.o. M with hx of anemia, anxiety, ESRD on HD (M/W/F), chronic pain, GERD, HLP, HTN, SSS s/p pacemaker syndrome, presenting to the ED for fever, body aches, cough, and SOB.  States he just started feeling bad last evening but worse today.  States he went to dialysis today as normal, was really having a hard time breathing so was placed on supplemental O2 while there.  States they cut his treatment 30 minutes short because he had reached a suitable level.  When they remove the oxygen he was still struggling to breathe encouraged to be seen here.  He reports dry cough that has been nonproductive, low-grade fevers, and intense body aches.  Reports he feels like "my bones are breaking".  He denies any chest pain.  States he feels extremely weak and fatigued.  He has not had any vomiting or diarrhea.  He has not had any known sick contacts or known Covid exposures.  States aside from attending dialysis sessions he is trying to remain at home as much as possible.  Past Medical History:  Diagnosis Date   Anemia    Anxiety    Takes Ativan   Benign hypertension 01/06/2016   Blood transfusion 2012   Chronic kidney disease    FOLLOWED BY DR Moshe Cipro   Chronic kidney disease, stage 5, kidney failure ([place]) 01/06/2016   managed NEPH 2016: 12 2017: dialysis   Chronic pain syndrome 05/16/2016   2016: onset 2017: opiates   Diverticulitis 2012   Diverticulosis of colon 01/06/2016   2016: diverticulitis   End stage renal disease ([employer]) 07/13/2011   GERD (gastroesophageal reflux disease)    Gout 01/06/2016    Hyperlipidemia, mixed 01/06/2016   Hypertension    takes Metoprolol but pt states it makes blood pressure drop;    Mechanical complication of other vascular device, implant, and graft 09/21/2011   Pacemaker 07/12/10   Dual Chamber Pacemaker ( Dr. Agustin Cree at [facility])   Pneumonia April 2012   PSVT (paroxysmal supraventricular tachycardia) ([place]) 01/06/2016   Secondary hyperparathyroidism ([place])    Sick sinus syndrome ([place]) 01/06/2016   pacemaker    Patient Active Problem List   Diagnosis Date Noted   Nonsustained ventricular tachycardia ([place]) 04/21/2019   Chronic pain syndrome 05/16/2016   Pacemaker 02/13/2016   Anxiety 01/06/2016   Hypertensive kidney disease with ESRD (end-stage renal disease) ([place]) 01/06/2016   Chronic kidney disease, stage 5, kidney failure ([place]) 01/06/2016   Hyperlipidemia, mixed 01/06/2016   PSVT (paroxysmal supraventricular tachycardia) ([place]) 01/06/2016   Sick sinus syndrome ([place]) 01/06/2016   Mechanical complication of other vascular device, implant, and graft 09/21/2011   End stage renal disease ([employer]) 07/13/2011    Past Surgical History:  Procedure Laterality Date   AV FISTULA PLACEMENT  07/17/2011   Procedure: ARTERIOVENOUS (AV) FISTULA CREATION;  Surgeon: Elam Dutch, MD;  Location: [facility] OR;  Service: Vascular;  Laterality: Right;   AV FISTULA PLACEMENT  10/04/2011   Procedure: ARTERIOVENOUS (AV) FISTULA CREATION;  Surgeon: Elam Dutch, MD;  Location: [place];  Service: Vascular;;   BUNIONECTOMY     HERNIA REPAIR     INSERT / REPLACE / REMOVE PACEMAKER  07/2010   PROSTATE SURGERY          Home Medications    Prior to Admission medications   Medication Sig Start Date End Date Taking? Authorizing Provider  allopurinol (ZYLOPRIM) 100 MG tablet Take 2 tablets by mouth daily. 07/06/16   [provider]  aspirin 81 MG tablet Take 81 mg by mouth daily.     [provider]  cinacalcet (SENSIPAR) 30 MG  tablet TAKE 1 TABLET BY MOUTH DAILY WITH FOOD (DO NOT TAKE LESS THAN 12 HOURS PRIOR TO DIALYSIS) 03/27/18   [provider]  Cyanocobalamin (VITAMIN B-12 CR PO) Take 1,000 mcg by mouth daily.     [provider]  metoprolol tartrate (LOPRESSOR) 25 MG tablet Take 1 tablet (25 mg total) by mouth 2 (two) times daily. 03/19/19 06/17/19  Camnitz, Ocie Doyne, MD  NITROSTAT 0.4 MG SL tablet Place 0.4 mg under the tongue every 5 (five) minutes as needed. For chest pain 07/10/11   [provider]  ondansetron (ZOFRAN) 4 MG tablet Take 1 tablet by mouth as needed for nausea. 11/28/17   [provider]  sevelamer carbonate (RENVELA) 800 MG tablet Take 1 tablet by mouth daily. 09/22/16   [provider]  tamsulosin (FLOMAX) 0.4 MG CAPS capsule Take 1 capsule by mouth 2 (two) times daily. 12/09/15   [provider]  vitamin E 400 UNIT capsule Take 400 Units by mouth daily.    [provider]    Family History Family History  Problem Relation Age of Onset   Heart disease Mother    Rheum arthritis Mother    Hypertension Father    Stroke Father    Hypertension Daughter    Hypertension Son    Rheum arthritis Sister    Diabetes Sister    Hypertension Sister    Hypertension Brother    Anesthesia problems Neg Hx    Hypotension Neg Hx    Malignant hyperthermia Neg Hx    Pseudochol deficiency Neg Hx     Social History Social History   Tobacco Use   Smoking status: Former Smoker    Packs/day: 1.50    Years: 20.00    Pack years: 30.00    Types: Cigarettes    Quit date: 11/16/1959    Years since quitting: 59.4   Smokeless tobacco: Never Used  Substance Use Topics   Alcohol use: No   Drug use: No     Allergies   Patient has no known allergies.   Review of Systems Review of Systems  Constitutional: Positive for fever.  Respiratory: Positive for cough and shortness of breath.   Neurological: Positive for weakness.    All other systems reviewed and are negative.    Physical Exam Updated Vital Signs BP 138/60 (BP Location: Left Arm)    Pulse 84    Temp 100.1 F (37.8 C) (Oral)    Resp (!) 22    SpO2 95%   Physical Exam Vitals signs and nursing note reviewed.  Constitutional:      Appearance: He is well-developed.     Comments: Elderly, appears ill, warm to touch  HENT:     Head: Normocephalic and atraumatic.  Eyes:     Conjunctiva/sclera: Conjunctivae normal.     Pupils: Pupils are equal, round, and reactive to light.  Neck:     Musculoskeletal: Normal  range of motion.  Cardiovascular:     Rate and Rhythm: Normal rate and regular rhythm.     Heart sounds: Normal heart sounds.  Pulmonary:     Effort: Pulmonary effort is normal. No respiratory distress.     Breath sounds: Normal breath sounds.     Comments: Increased work of breathing, some mild retractions, able to speak in short sentences but gets winded, coarse breath sounds, 4L O2 in use, O2 sats 98% during exam Abdominal:     General: Bowel sounds are normal.     Palpations: Abdomen is soft.  Musculoskeletal: Normal range of motion.     Comments: dialysis access RUE, bandage in place, thrill noted  Skin:    General: Skin is warm and dry.  Neurological:     Mental Status: He is alert and oriented to person, place, and time.      ED Treatments / Results  Labs (all labs ordered are listed, but only abnormal results are displayed) Labs Reviewed  CBC WITH DIFFERENTIAL/PLATELET - Abnormal; Notable for the following components:      Result Value   WBC 1.8 (*)    Platelets 74 (*)    Neutro Abs 1.4 (*)    Lymphs Abs 0.3 (*)    All other components within normal limits  COMPREHENSIVE METABOLIC PANEL - Abnormal; Notable for the following components:   Chloride 93 (*)    Glucose, Bld 101 (*)    BUN 25 (*)    Creatinine, Ser 5.97 (*)    Calcium 8.0 (*)    Total Protein 6.0 (*)    Albumin 3.2 (*)    AST 53 (*)    GFR calc non Af  Amer 8 (*)    GFR calc Af Amer 9 (*)    Anion gap 17 (*)    All other components within normal limits  D-DIMER, QUANTITATIVE (NOT AT [facility]) - Abnormal; Notable for the following components:   D-Dimer, Quant 2.15 (*)    All other components within normal limits  LACTATE DEHYDROGENASE - Abnormal; Notable for the following components:   LDH 388 (*)    All other components within normal limits  FERRITIN - Abnormal; Notable for the following components:   Ferritin 4,332 (*)    All other components within normal limits  FIBRINOGEN - Abnormal; Notable for the following components:   Fibrinogen 482 (*)    All other components within normal limits  C-REACTIVE PROTEIN - Abnormal; Notable for the following components:   CRP 12.9 (*)    All other components within normal limits  SARS CORONAVIRUS 2 (TAT 6-24 HRS)  CULTURE, BLOOD (ROUTINE X 2)  CULTURE, BLOOD (ROUTINE X 2)  LACTIC ACID, PLASMA  PROCALCITONIN  TRIGLYCERIDES  LACTIC ACID, PLASMA    EKG EKG Interpretation  Date/Time:  Wednesday April 23 2019 22:18:14 EST Ventricular Rate:  101 PR Interval:    QRS Duration: 109 QT Interval:  389 QTC Calculation: 466 R Axis:   -35 Text Interpretation: Ventricular bigeminy Left axis deviation No significant change since last tracing Confirmed by Isla Pence [phone]) on 04/23/2019 10:26:49 PM   Radiology Dg Chest Port 1 View  Result Date: 04/23/2019 CLINICAL DATA:  Shortness of breath, fever EXAM: PORTABLE CHEST 1 VIEW COMPARISON:  11/26/2015 FINDINGS: Left pacer remains in place, unchanged. Low lung volumes. Airspace disease noted in the right upper lobe, left upper lobe and right perihilar region. This likely reflects pneumonia. Heart is borderline in size. No acute bony abnormality.  IMPRESSION: Bilateral airspace opacities most compatible with multifocal pneumonia. Followup PA and lateral chest X-ray is recommended in 3-4 weeks following trial of antibiotic therapy to ensure  resolution and exclude underlying malignancy. Electronically Signed   By: Rolm Baptise M.D.   On: 04/23/2019 22:53    Procedures Procedures (including critical care time)  CRITICAL CARE Performed by: Larene Pickett   Total critical care time: 45 minutes  Critical care time was exclusive of separately billable procedures and treating other patients.  Critical care was necessary to treat or prevent imminent or life-threatening deterioration.  Critical care was time spent personally by me on the following activities: development of treatment plan with patient and/or surrogate as well as nursing, discussions with consultants, evaluation of patient's response to treatment, examination of patient, obtaining history from patient or surrogate, ordering and performing treatments and interventions, ordering and review of laboratory studies, ordering and review of radiographic studies, pulse oximetry and re-evaluation of patient's condition.   Medications Ordered in ED Medications  vancomycin (VANCOCIN) 1,750 mg in sodium chloride 0.9 % 500 mL IVPB (1,750 mg Intravenous New Bag/Given 04/24/19 0039)    Followed by  vancomycin (VANCOCIN) IVPB 750 mg/150 ml premix (has no administration in time range)  ceFEPIme (MAXIPIME) 1 g in sodium chloride 0.9 % 100 mL IVPB (0 g Intravenous Stopped 04/24/19 0122)  albuterol (VENTOLIN HFA) 108 (90 Base) MCG/ACT inhaler 8 puff (8 puffs Inhalation Given 04/24/19 0042)  methylPREDNISolone sodium succinate (SOLU-MEDROL) 125 mg/2 mL injection 125 mg (125 mg Intravenous Given 04/24/19 0024)  acetaminophen (TYLENOL) tablet 1,000 mg (1,000 mg Oral Given 04/24/19 0022)     Initial Impression / Assessment and Plan / ED Course  I have reviewed the triage vital signs and the nursing notes.  Pertinent labs & imaging results that were available during my care of the patient were reviewed by me and considered in my medical decision making (see chart for details).  85 y.o.  M here with dry cough, fever, body aches, and shortness of breath.   He did attend dialysis today, session was cut 30 minutes short.  Was requiring oxygen during treatment but hypoxic when removed so sent here for further evaluation.  Low-grade fever here and still requiring 4 L supplemental O2.  He does have increased work of breathing, retractions, and very coarse breath sounds bilaterally.  Clinical picture very concerning for COVID-19.  Labs, chest x-ray, cultures, and inflammatory markers pending.  Given 125mg  solumedrol and albuterol inhaler.    Questioned patient regarding his code status, he deferred that decision to his daughter, [name], states she will make all of his medical decisions.  11:01 PM Spoke with daughter [name] [phone]).  States patient would want intubation, pressors, etc if needed, she is not sure about CPR.  States her brother was recently diagnosed with COVID (he works at hospital in [place]).  He had been down checking on patient every day over the past week, was not displaying symptoms until this week when he got tested.  1:30 AM Patient still doing well on 4L, O2 sats 99%, HR in the 80's.  Labs as above-- consistent with likely COVID infection-- low WBC count, elevated d-dimer, fibrinogen, etc.  Chemistry appears consistent with his known dialysis status.  K+ 3.6.  Will admit to [facility] as he will need dialysis services.  Discussed with IM teaching service-- they will admit for ongoing care.  Made aware of daughters wishes at present.  Final Clinical Impressions(s) / ED Diagnoses  Final diagnoses:  Shortness of breath  Suspected COVID-19 virus infection    ED Discharge Orders    None       Larene Pickett, PA-C 04/24/19 0228    Isla Pence, MD 04/28/19 [phone]

## 2019-04-23 NOTE — Progress Notes (Signed)
Pharmacy Antibiotic Note  Timothy Carney is a 85 y.o. male admitted on 04/23/2019 with pneumonia.  Pharmacy has been consulted for Cefepime and Vancomycin dosing.     Temp (24hrs), Avg:100.1 F (37.8 C), Min:100.1 F (37.8 C), Max:100.1 F (37.8 C)  No results for input(s): WBC, CREATININE, LATICACIDVEN, VANCOTROUGH, VANCOPEAK, VANCORANDOM, GENTTROUGH, GENTPEAK, GENTRANDOM, TOBRATROUGH, TOBRAPEAK, TOBRARND, AMIKACINPEAK, AMIKACINTROU, AMIKACIN in the last 168 hours.  CrCl cannot be calculated (No successful lab value found.).    No Known Allergies  Antimicrobials this admission: 11/18 Cefepieme >>  11/18 Vancomycin >>   Dose adjustments this admission: n/a  Microbiology results: 11/18 BCx: pending  Plan: - Patient is an HD patient MWF  - Will dose Cefepime 1g IV q24h  - Loading dose of Vancomycin 1750 mg IV x 1 dose - Continue with Vancomycin 750 mg IV Q-MWF after HD  - Will monitor HD sessions to ensure patient tolerates whole sessions and adjust Vancomycin dose as needed.    Thank you for allowing pharmacy to be a part of this patient's care.  Duanne Limerick PharmD. BCPS  04/23/2019 11:20 PM

## 2019-04-24 DIAGNOSIS — U071 COVID-19: Secondary | ICD-10-CM | POA: Diagnosis present

## 2019-04-24 DIAGNOSIS — J9601 Acute respiratory failure with hypoxia: Secondary | ICD-10-CM | POA: Diagnosis present

## 2019-04-24 DIAGNOSIS — Z79899 Other long term (current) drug therapy: Secondary | ICD-10-CM | POA: Diagnosis not present

## 2019-04-24 DIAGNOSIS — I495 Sick sinus syndrome: Secondary | ICD-10-CM | POA: Diagnosis present

## 2019-04-24 DIAGNOSIS — Z87891 Personal history of nicotine dependence: Secondary | ICD-10-CM | POA: Diagnosis not present

## 2019-04-24 DIAGNOSIS — Z950 Presence of cardiac pacemaker: Secondary | ICD-10-CM | POA: Diagnosis not present

## 2019-04-24 DIAGNOSIS — Z7982 Long term (current) use of aspirin: Secondary | ICD-10-CM | POA: Diagnosis not present

## 2019-04-24 DIAGNOSIS — Z8249 Family history of ischemic heart disease and other diseases of the circulatory system: Secondary | ICD-10-CM | POA: Diagnosis not present

## 2019-04-24 DIAGNOSIS — R0602 Shortness of breath: Secondary | ICD-10-CM | POA: Diagnosis present

## 2019-04-24 DIAGNOSIS — J1289 Other viral pneumonia: Secondary | ICD-10-CM | POA: Diagnosis present

## 2019-04-24 DIAGNOSIS — N2581 Secondary hyperparathyroidism of renal origin: Secondary | ICD-10-CM | POA: Diagnosis present

## 2019-04-24 DIAGNOSIS — Z833 Family history of diabetes mellitus: Secondary | ICD-10-CM | POA: Diagnosis not present

## 2019-04-24 DIAGNOSIS — N400 Enlarged prostate without lower urinary tract symptoms: Secondary | ICD-10-CM | POA: Diagnosis present

## 2019-04-24 DIAGNOSIS — D696 Thrombocytopenia, unspecified: Secondary | ICD-10-CM | POA: Diagnosis present

## 2019-04-24 DIAGNOSIS — E8889 Other specified metabolic disorders: Secondary | ICD-10-CM | POA: Diagnosis present

## 2019-04-24 DIAGNOSIS — Z8261 Family history of arthritis: Secondary | ICD-10-CM | POA: Diagnosis not present

## 2019-04-24 DIAGNOSIS — N186 End stage renal disease: Secondary | ICD-10-CM | POA: Diagnosis present

## 2019-04-24 DIAGNOSIS — M109 Gout, unspecified: Secondary | ICD-10-CM | POA: Diagnosis present

## 2019-04-24 DIAGNOSIS — R7401 Elevation of levels of liver transaminase levels: Secondary | ICD-10-CM | POA: Diagnosis present

## 2019-04-24 DIAGNOSIS — Z992 Dependence on renal dialysis: Secondary | ICD-10-CM | POA: Diagnosis not present

## 2019-04-24 DIAGNOSIS — Z823 Family history of stroke: Secondary | ICD-10-CM | POA: Diagnosis not present

## 2019-04-24 DIAGNOSIS — D631 Anemia in chronic kidney disease: Secondary | ICD-10-CM | POA: Diagnosis present

## 2019-04-24 DIAGNOSIS — I120 Hypertensive chronic kidney disease with stage 5 chronic kidney disease or end stage renal disease: Secondary | ICD-10-CM | POA: Diagnosis present

## 2019-04-24 LAB — CBC WITH DIFFERENTIAL/PLATELET
Abs Immature Granulocytes: 0.02 10*3/uL (ref 0.00–0.07)
Basophils Absolute: 0 10*3/uL (ref 0.0–0.1)
Basophils Relative: 1 %
Eosinophils Absolute: 0 10*3/uL (ref 0.0–0.5)
Eosinophils Relative: 0 %
HCT: 49.4 % (ref 39.0–52.0)
Hemoglobin: 15.8 g/dL (ref 13.0–17.0)
Immature Granulocytes: 1 %
Lymphocytes Relative: 15 %
Lymphs Abs: 0.3 10*3/uL — ABNORMAL LOW (ref 0.7–4.0)
MCH: 31.4 pg (ref 26.0–34.0)
MCHC: 32 g/dL (ref 30.0–36.0)
MCV: 98.2 fL (ref 80.0–100.0)
Monocytes Absolute: 0.2 10*3/uL (ref 0.1–1.0)
Monocytes Relative: 8 %
Neutro Abs: 1.4 10*3/uL — ABNORMAL LOW (ref 1.7–7.7)
Neutrophils Relative %: 75 %
Platelets: 74 10*3/uL — ABNORMAL LOW (ref 150–400)
RBC: 5.03 MIL/uL (ref 4.22–5.81)
RDW: 14.3 % (ref 11.5–15.5)
WBC: 1.8 10*3/uL — ABNORMAL LOW (ref 4.0–10.5)
nRBC: 0 % (ref 0.0–0.2)

## 2019-04-24 LAB — COMPREHENSIVE METABOLIC PANEL
ALT: 32 U/L (ref 0–44)
ALT: 30 U/L (ref 0–44)
AST: 53 U/L — ABNORMAL HIGH (ref 15–41)
AST: 46 U/L — ABNORMAL HIGH (ref 15–41)
Albumin: 3.2 g/dL — ABNORMAL LOW (ref 3.5–5.0)
Albumin: 2.9 g/dL — ABNORMAL LOW (ref 3.5–5.0)
Alkaline Phosphatase: 90 U/L (ref 38–126)
Alkaline Phosphatase: 85 U/L (ref 38–126)
Anion gap: 17 — ABNORMAL HIGH (ref 5–15)
Anion gap: 19 — ABNORMAL HIGH (ref 5–15)
BUN: 25 mg/dL — ABNORMAL HIGH (ref 8–23)
BUN: 30 mg/dL — ABNORMAL HIGH (ref 8–23)
CO2: 27 mmol/L (ref 22–32)
CO2: 26 mmol/L (ref 22–32)
Calcium: 8 mg/dL — ABNORMAL LOW (ref 8.9–10.3)
Calcium: 8.1 mg/dL — ABNORMAL LOW (ref 8.9–10.3)
Chloride: 93 mmol/L — ABNORMAL LOW (ref 98–111)
Chloride: 94 mmol/L — ABNORMAL LOW (ref 98–111)
Creatinine, Ser: 5.97 mg/dL — ABNORMAL HIGH (ref 0.61–1.24)
Creatinine, Ser: 6.4 mg/dL — ABNORMAL HIGH (ref 0.61–1.24)
GFR calc Af Amer: 9 mL/min — ABNORMAL LOW (ref >60)
GFR calc Af Amer: 8 mL/min — ABNORMAL LOW (ref >60)
GFR calc non Af Amer: 8 mL/min — ABNORMAL LOW (ref >60)
GFR calc non Af Amer: 7 mL/min — ABNORMAL LOW (ref >60)
Glucose, Bld: 101 mg/dL — ABNORMAL HIGH (ref 70–99)
Glucose, Bld: 127 mg/dL — ABNORMAL HIGH (ref 70–99)
Potassium: 3.6 mmol/L (ref 3.5–5.1)
Potassium: 4 mmol/L (ref 3.5–5.1)
Sodium: 137 mmol/L (ref 135–145)
Sodium: 139 mmol/L (ref 135–145)
Total Bilirubin: 0.9 mg/dL (ref 0.3–1.2)
Total Bilirubin: 1 mg/dL (ref 0.3–1.2)
Total Protein: 6 g/dL — ABNORMAL LOW (ref 6.5–8.1)
Total Protein: 6.1 g/dL — ABNORMAL LOW (ref 6.5–8.1)

## 2019-04-24 LAB — D-DIMER, QUANTITATIVE: D-Dimer, Quant: 2.15 ug/mL-FEU — ABNORMAL HIGH (ref 0.00–0.50)

## 2019-04-24 LAB — CBC
HCT: 38.2 % — ABNORMAL LOW (ref 39.0–52.0)
Hemoglobin: 11.7 g/dL — ABNORMAL LOW (ref 13.0–17.0)
MCH: 30.9 pg (ref 26.0–34.0)
MCHC: 30.6 g/dL (ref 30.0–36.0)
MCV: 100.8 fL — ABNORMAL HIGH (ref 80.0–100.0)
Platelets: 162 10*3/uL (ref 150–400)
RBC: 3.79 MIL/uL — ABNORMAL LOW (ref 4.22–5.81)
RDW: 14.3 % (ref 11.5–15.5)
WBC: 3.1 10*3/uL — ABNORMAL LOW (ref 4.0–10.5)
nRBC: 0 % (ref 0.0–0.2)

## 2019-04-24 LAB — LACTIC ACID, PLASMA: Lactic Acid, Venous: 1.2 mmol/L (ref 0.5–1.9)

## 2019-04-24 LAB — PROCALCITONIN: Procalcitonin: 0.85 ng/mL

## 2019-04-24 LAB — FIBRINOGEN: Fibrinogen: 482 mg/dL — ABNORMAL HIGH (ref 210–475)

## 2019-04-24 LAB — LACTATE DEHYDROGENASE: LDH: 388 U/L — ABNORMAL HIGH (ref 98–192)

## 2019-04-24 LAB — FERRITIN: Ferritin: 4332 ng/mL — ABNORMAL HIGH (ref 24–336)

## 2019-04-24 LAB — TRIGLYCERIDES: Triglycerides: 98 mg/dL (ref <150)

## 2019-04-24 LAB — SARS CORONAVIRUS 2 (TAT 6-24 HRS): SARS Coronavirus 2: POSITIVE — AB

## 2019-04-24 LAB — C-REACTIVE PROTEIN: CRP: 12.9 mg/dL — ABNORMAL HIGH (ref <1.0)

## 2019-04-24 MED ORDER — SODIUM CHLORIDE 0.9 % IV SOLN
100.0000 mg | INTRAVENOUS | Status: AC
  Administered 2019-04-25 – 2019-04-28 (×4): 100 mg via INTRAVENOUS
  Filled 2019-04-24 (×5): qty 20

## 2019-04-24 MED ORDER — SENNOSIDES-DOCUSATE SODIUM 8.6-50 MG PO TABS: 1.0000 | ORAL_TABLET | Freq: Every evening | ORAL | Status: DC | PRN

## 2019-04-24 MED ORDER — ALLOPURINOL 100 MG PO TABS
200.0000 mg | ORAL_TABLET | Freq: Every day | ORAL | Status: DC
  Administered 2019-04-24 – 2019-05-01 (×8): 200 mg via ORAL
  Filled 2019-04-24 (×8): qty 2

## 2019-04-24 MED ORDER — CALCITRIOL 0.25 MCG PO CAPS
0.7500 ug | ORAL_CAPSULE | ORAL | Status: DC
  Administered 2019-04-26 – 2019-05-01 (×3): 0.75 ug via ORAL
  Filled 2019-04-24 (×4): qty 3

## 2019-04-24 MED ORDER — SODIUM CHLORIDE 0.9 % IV SOLN
62.5000 mg | INTRAVENOUS | Status: DC
  Administered 2019-04-29: 62.5 mg via INTRAVENOUS
  Filled 2019-04-24: qty 5

## 2019-04-24 MED ORDER — DEXAMETHASONE 4 MG PO TABS
6.0000 mg | ORAL_TABLET | Freq: Every day | ORAL | Status: DC
  Administered 2019-04-24 – 2019-05-01 (×8): 6 mg via ORAL
  Filled 2019-04-24 (×8): qty 2

## 2019-04-24 MED ORDER — ASPIRIN EC 81 MG PO TBEC
81.0000 mg | DELAYED_RELEASE_TABLET | Freq: Every day | ORAL | Status: DC
  Administered 2019-04-24 – 2019-05-01 (×8): 81 mg via ORAL
  Filled 2019-04-24 (×8): qty 1

## 2019-04-24 MED ORDER — ACETAMINOPHEN 500 MG PO TABS
1000.0000 mg | ORAL_TABLET | Freq: Once | ORAL | Status: AC
  Administered 2019-04-24: 1000 mg via ORAL
  Filled 2019-04-24: qty 2

## 2019-04-24 MED ORDER — SEVELAMER CARBONATE 800 MG PO TABS
1600.0000 mg | ORAL_TABLET | Freq: Three times a day (TID) | ORAL | Status: DC
  Administered 2019-04-24 – 2019-05-01 (×18): 1600 mg via ORAL
  Filled 2019-04-24 (×24): qty 2

## 2019-04-24 MED ORDER — TAMSULOSIN HCL 0.4 MG PO CAPS
0.4000 mg | ORAL_CAPSULE | Freq: Two times a day (BID) | ORAL | Status: DC
  Administered 2019-04-24 – 2019-05-01 (×15): 0.4 mg via ORAL
  Filled 2019-04-24 (×15): qty 1

## 2019-04-24 MED ORDER — DEXAMETHASONE SODIUM PHOSPHATE 10 MG/ML IJ SOLN: 6.0000 mg | INTRAMUSCULAR | Status: DC

## 2019-04-24 MED ORDER — ONDANSETRON HCL 4 MG PO TABS
4.0000 mg | ORAL_TABLET | Freq: Four times a day (QID) | ORAL | Status: DC | PRN
  Administered 2019-04-25 – 2019-04-28 (×2): 4 mg via ORAL
  Filled 2019-04-24 (×2): qty 1

## 2019-04-24 MED ORDER — ONDANSETRON HCL 4 MG/2ML IJ SOLN: 4.0000 mg | Freq: Four times a day (QID) | INTRAMUSCULAR | Status: DC | PRN

## 2019-04-24 MED ORDER — SODIUM CHLORIDE 0.9% FLUSH
3.0000 mL | Freq: Two times a day (BID) | INTRAVENOUS | Status: DC
  Administered 2019-04-24 – 2019-05-01 (×15): 3 mL via INTRAVENOUS

## 2019-04-24 MED ORDER — RENA-VITE PO TABS
1.0000 | ORAL_TABLET | Freq: Every day | ORAL | Status: DC
  Administered 2019-04-24 – 2019-04-30 (×7): 1 via ORAL
  Filled 2019-04-24 (×8): qty 1

## 2019-04-24 MED ORDER — HEPARIN SODIUM (PORCINE) 5000 UNIT/ML IJ SOLN
5000.0000 [IU] | Freq: Three times a day (TID) | INTRAMUSCULAR | Status: DC
  Administered 2019-04-24 – 2019-05-01 (×20): 5000 [IU] via SUBCUTANEOUS
  Filled 2019-04-24 (×20): qty 1

## 2019-04-24 MED ORDER — ACETAMINOPHEN 650 MG RE SUPP: 650.0000 mg | Freq: Four times a day (QID) | RECTAL | Status: DC | PRN

## 2019-04-24 MED ORDER — CINACALCET HCL 30 MG PO TABS
30.0000 mg | ORAL_TABLET | Freq: Every day | ORAL | Status: DC
  Administered 2019-04-24 – 2019-05-01 (×8): 30 mg via ORAL
  Filled 2019-04-24 (×9): qty 1

## 2019-04-24 MED ORDER — SODIUM CHLORIDE 0.9 % IV SOLN
200.0000 mg | Freq: Once | INTRAVENOUS | Status: AC
  Administered 2019-04-24: 200 mg via INTRAVENOUS
  Filled 2019-04-24: qty 40

## 2019-04-24 MED ORDER — ACETAMINOPHEN 325 MG PO TABS
650.0000 mg | ORAL_TABLET | Freq: Four times a day (QID) | ORAL | Status: DC | PRN
  Administered 2019-04-25 – 2019-04-30 (×6): 650 mg via ORAL
  Filled 2019-04-24 (×6): qty 2

## 2019-04-24 MED ORDER — METOPROLOL TARTRATE 12.5 MG HALF TABLET
12.5000 mg | ORAL_TABLET | ORAL | Status: DC
  Administered 2019-04-24 – 2019-04-26 (×3): 12.5 mg via ORAL
  Filled 2019-04-24 (×2): qty 1

## 2019-04-24 NOTE — ED Notes (Signed)
Please call the son at [phone] with an update

## 2019-04-24 NOTE — Consult Note (Addendum)
[facility] Renal Consultation Note  Indication for Consultation:  Management of ESRD/hemodialysis; anemia, hypertension/volume and secondary hyperparathyroidism  HPI: Timothy Carney is a 85 y.o. male with ESRD due to HTN ( [facility] TTS schedule compliant ho  )  , started HD 05/2016, BPH, gout, anxiety, Hx kidney stones, Hx tachy-brady syndrome (s/p pacemaker) now admitted with Acute hypoxemic respiratory failure secondary to COVID-19.   He last had HD yesterday 11/18=3hr 4min  in isolation @ [facility]   2/2  to Covid symptoms on tues 11/17 , He was taken off HD secondary to worsening symptoms  sob "feel like bones felt like exploding "            Noted Tues symptoms  + diarrhea, emesis x 1 on 04/21/19, weak, nonproductive cough since 04/20/19, decreased appetit,noted exposed to son last week /Son admitted to now admitted to [facility]  on Vent with  Covid .                                                                      Review of ER record = temp 100.1 ,  bp 138/60   O2 sat 100 % on 2L  [place]  K 4.0 Na 139, CA 8.1  Alb 2.9    hgb 11.7       CXR =   Bilateral airspace opacities most compatible with multifocal pneumonia.                                                                                                                                                                                                                                                                                                                                                                                                                                                                                                                                                                                                                                                                                                                                                                                                                                                                                                            Marland Kitchen  Past Medical History:  Diagnosis Date  . Anemia   . Anxiety    Takes Ativan  . Benign hypertension 01/06/2016  . Blood transfusion 2012  . Chronic kidney disease    FOLLOWED BY DR Moshe Cipro  . Chronic kidney disease, stage 5, kidney failure ([place]) 01/06/2016   managed NEPH 2016: 12 2017: dialysis  . Chronic pain syndrome 05/16/2016   2016: onset 2017: opiates  . Diverticulitis 2012  . Diverticulosis of colon 01/06/2016   2016: diverticulitis  . End stage renal disease ([place]) 07/13/2011  . GERD (gastroesophageal reflux disease)   . Gout 01/06/2016  . Hyperlipidemia, mixed 01/06/2016  . Hypertension    takes Metoprolol but pt states it makes blood pressure drop;   Marland Kitchen Mechanical complication of other vascular device, implant, and graft 09/21/2011  . Pacemaker 07/12/10   Dual Chamber Pacemaker ( Dr. Agustin Cree at [facility])  . Pneumonia April 2012  . PSVT (paroxysmal supraventricular tachycardia) ([place]) 01/06/2016  . Secondary hyperparathyroidism ([place])   . Sick sinus syndrome ([place]) 01/06/2016   pacemaker    Past Surgical History:  Procedure Laterality Date  . AV FISTULA PLACEMENT  07/17/2011   Procedure: ARTERIOVENOUS (AV) FISTULA CREATION;  Surgeon: Elam Dutch, MD;  Location: [facility] OR;  Service: Vascular;  Laterality: Right;  . AV FISTULA PLACEMENT  10/04/2011   Procedure: ARTERIOVENOUS (AV) FISTULA CREATION;  Surgeon: Elam Dutch, MD;  Location: [facility];  Service: Vascular;;  . Lillard Anes    . HERNIA REPAIR    . INSERT / REPLACE / REMOVE PACEMAKER  07/2010  . PROSTATE SURGERY        Family History  Problem Relation Age of Onset  . Heart disease Mother   . Rheum arthritis Mother   . Hypertension Father   . Stroke Father   . Hypertension Daughter   . Hypertension Son   . Rheum arthritis Sister   . Diabetes Sister   . Hypertension Sister   . Hypertension  Brother   . Anesthesia problems Neg Hx   . Hypotension Neg Hx   . Malignant hyperthermia Neg Hx   . Pseudochol deficiency Neg Hx       reports that he quit smoking about 59 years ago. His smoking use included cigarettes. He has a 30.00 pack-year smoking history. He has never used smokeless tobacco. He reports that he does not drink alcohol or use drugs.  No Known Allergies  Prior to Admission medications   Medication Sig Start Date End Date Taking? Authorizing Provider  allopurinol (ZYLOPRIM) 100 MG tablet Take 2 tablets by mouth daily. 07/06/16   [provider]  aspirin 81 MG tablet Take 81 mg by mouth daily.     [provider]  cinacalcet (SENSIPAR) 30 MG tablet Take 30 mg by mouth See admin instructions. Take 30 mg by mouth once a day with food (DO NOT TAKE LESS THAN 12 HOURS PRIOR TO DIALYSIS) 03/27/18   [provider]  Cyanocobalamin (VITAMIN B-12 CR PO) Take 1,000 mcg by mouth daily.     [provider]  metoprolol tartrate (LOPRESSOR) 25 MG tablet Take 1 tablet (25 mg total) by mouth 2 (two) times daily. 03/19/19 06/17/19  Camnitz, Ocie Doyne, MD  NITROSTAT 0.4 MG SL tablet Place 0.4 mg under the tongue every 5 (five) minutes as needed. For chest pain 07/10/11   [provider]  ondansetron (ZOFRAN) 4 MG tablet Take 1 tablet by mouth as needed for nausea. 11/28/17   [provider]  sevelamer carbonate (RENVELA) 800 MG tablet Take 1 tablet by mouth daily. 09/22/16   [provider]  tamsulosin (FLOMAX) 0.4 MG CAPS capsule Take 0.4 mg by mouth 2 (two) times daily.  12/09/15   [provider]  vitamin E 400 UNIT capsule Take 400 Units by mouth daily.    [provider]     Anti-infectives (From admission, onward)   Start     Dose/Rate Route Frequency Ordered Stop   04/25/19 1200  vancomycin (VANCOCIN) IVPB 750 mg/150 ml premix  Status:  Discontinued     750 mg 150 mL/hr over 60 Minutes Intravenous Every M-W-F  (Hemodialysis) 04/23/19 2319 04/24/19 0245   04/25/19 1000  remdesivir 100 mg in sodium chloride 0.9 % 250 mL IVPB     100 mg 500 mL/hr over 30 Minutes Intravenous Every 24 hours 04/24/19 0252 04/29/19 0959   04/24/19 0400  remdesivir 200 mg in sodium chloride 0.9 % 250 mL IVPB     200 mg 500 mL/hr over 30 Minutes Intravenous Once 04/24/19 0252 04/24/19 0701   04/23/19 2330  vancomycin (VANCOCIN) 1,750 mg in sodium chloride 0.9 % 500 mL IVPB     1,750 mg 250 mL/hr over 120 Minutes Intravenous  Once 04/23/19 2319 04/24/19 0255   04/23/19 2330  ceFEPIme (MAXIPIME) 1 g in sodium chloride 0.9 % 100 mL IVPB  Status:  Discontinued     1 g 200 mL/hr over 30 Minutes Intravenous Every 24 hours 04/23/19 2319 04/24/19 0245      Results for orders placed or performed during the hospital encounter of 04/23/19 (from the past 48 hour(s))  Lactic acid, plasma     Status: None   Collection Time: 04/24/19 12:19 AM  Result Value Ref Range   Lactic Acid, Venous 1.2 0.5 - 1.9 mmol/L    Comment: Performed at [facility], [place] [address] [place], [place] [address]  CBC WITH DIFFERENTIAL     Status: Abnormal   Collection Time: 04/24/19 12:19 AM  Result Value Ref Range   WBC 1.8 (L) 4.0 - 10.5 K/uL   RBC 5.03 4.22 - 5.81 MIL/uL   Hemoglobin 15.8 13.0 - 17.0 g/dL   HCT 49.4 39.0 - 52.0 %   MCV 98.2 80.0 - 100.0 fL   MCH 31.4 26.0 - 34.0 pg   MCHC 32.0 30.0 - 36.0 g/dL   RDW 14.3 11.5 - 15.5 %   Platelets 74 (L) 150 - 400 K/uL    Comment: REPEATED TO VERIFY PLATELET COUNT CONFIRMED BY SMEAR Immature Platelet Fraction may be clinically indicated, consider ordering this additional test [id number]    nRBC 0.0 0.0 - 0.2 %   Neutrophils Relative % 75 %   Neutro Abs 1.4 (L) 1.7 - 7.7 K/uL   Lymphocytes Relative 15 %   Lymphs Abs 0.3 (L) 0.7 - 4.0 K/uL   Monocytes Relative 8 %   Monocytes Absolute 0.2 0.1 - 1.0 K/uL   Eosinophils Relative 0 %   Eosinophils Absolute 0.0 0.0 - 0.5 K/uL   Basophils  Relative 1 %   Basophils Absolute 0.0 0.0 - 0.1 K/uL   Immature Granulocytes 1 %   Abs Immature Granulocytes 0.02 0.00 - 0.07 K/uL    Comment: Performed at [facility], [place] [address] [place], [place] [address]  Comprehensive metabolic panel     Status: Abnormal   Collection Time: 04/24/19 12:19 AM  Result Value Ref Range   Sodium 137  135 - 145 mmol/L   Potassium 3.6 3.5 - 5.1 mmol/L   Chloride 93 (L) 98 - 111 mmol/L   CO2 27 22 - 32 mmol/L   Glucose, Bld 101 (H) 70 - 99 mg/dL   BUN 25 (H) 8 - 23 mg/dL   Creatinine, Ser 5.97 (H) 0.61 - 1.24 mg/dL   Calcium 8.0 (L) 8.9 - 10.3 mg/dL   Total Protein 6.0 (L) 6.5 - 8.1 g/dL   Albumin 3.2 (L) 3.5 - 5.0 g/dL   AST 53 (H) 15 - 41 U/L   ALT 32 0 - 44 U/L   Alkaline Phosphatase 90 38 - 126 U/L   Total Bilirubin 0.9 0.3 - 1.2 mg/dL   GFR calc non Af Amer 8 (L) >60 mL/min   GFR calc Af Amer 9 (L) >60 mL/min   Anion gap 17 (H) 5 - 15    Comment: Performed at [facility], [place] [address], [place], [place] [address]  D-dimer, quantitative     Status: Abnormal   Collection Time: 04/24/19 12:19 AM  Result Value Ref Range   D-Dimer, Quant 2.15 (H) 0.00 - 0.50 ug/mL-FEU    Comment: (NOTE) At the manufacturer cut-off of 0.50 ug/mL FEU, this assay has been documented to exclude PE with a sensitivity and negative predictive value of 97 to 99%.  At this time, this assay has not been approved by the FDA to exclude DVT/VTE. Results should be correlated with clinical presentation. Performed at [facility], [place] [address] [place], [place] [address]   Procalcitonin     Status: None   Collection Time: 04/24/19 12:19 AM  Result Value Ref Range   Procalcitonin 0.85 ng/mL    Comment:        Interpretation: PCT > 0.5 ng/mL and <= 2 ng/mL: Systemic infection (sepsis) is possible, but other conditions are known to elevate PCT as well. (NOTE)       Sepsis PCT Algorithm           Lower Respiratory Tract                                       Infection PCT Algorithm    ----------------------------     ----------------------------         PCT < 0.25 ng/mL                PCT < 0.10 ng/mL         Strongly encourage             Strongly discourage   discontinuation of antibiotics    initiation of antibiotics    ----------------------------     -----------------------------       PCT 0.25 - 0.50 ng/mL            PCT 0.10 - 0.25 ng/mL               OR       >80% decrease in PCT            Discourage initiation of                                            antibiotics      Encourage discontinuation  of antibiotics    ----------------------------     -----------------------------         PCT >= 0.50 ng/mL              PCT 0.26 - 0.50 ng/mL                AND       <80% decrease in PCT             Encourage initiation of                                             antibiotics       Encourage continuation           of antibiotics    ----------------------------     -----------------------------        PCT >= 0.50 ng/mL                  PCT > 0.50 ng/mL               AND         increase in PCT                  Strongly encourage                                      initiation of antibiotics    Strongly encourage escalation           of antibiotics                                     -----------------------------                                           PCT <= 0.25 ng/mL                                                 OR                                        > 80% decrease in PCT                                     Discontinue / Do not initiate                                             antibiotics Performed at [facility], [address], [place], [place] [address]   Lactate dehydrogenase     Status: Abnormal   Collection Time: 04/24/19 12:19 AM  Result Value Ref Range   LDH 388 (H) 98 - 192 U/L  Comment: Performed at [facility], [place] [address] [place], [place] [address]  Ferritin      Status: Abnormal   Collection Time: 04/24/19 12:19 AM  Result Value Ref Range   Ferritin 4,332 (H) 24 - 336 ng/mL    Comment: Performed at [facility], [place] [address], [place], [place] [address]  Triglycerides     Status: None   Collection Time: 04/24/19 12:19 AM  Result Value Ref Range   Triglycerides 98 <150 mg/dL    Comment: Performed at [facility] [address], [place], [place] [address]  Fibrinogen     Status: Abnormal   Collection Time: 04/24/19 12:19 AM  Result Value Ref Range   Fibrinogen 482 (H) 210 - 475 mg/dL    Comment: Performed at [facility] [address] [place], [place] [address]  C-reactive protein     Status: Abnormal   Collection Time: 04/24/19 12:19 AM  Result Value Ref Range   CRP 12.9 (H) <1.0 mg/dL    Comment: Performed at [facility] [address], [place], [place] [address]  SARS CORONAVIRUS 2 (TAT 6-24 HRS) Nasopharyngeal Nasopharyngeal Swab     Status: Abnormal   Collection Time: 04/24/19 12:43 AM   Specimen: Nasopharyngeal Swab  Result Value Ref Range   SARS Coronavirus 2 POSITIVE (A) NEGATIVE    Comment: RESULT CALLED TO, READ BACK BY AND VERIFIED WITH: Bethena Midget, [facility] ED CHARGE RN AT [phone] ON 04/24/19 BY C. JESSUP, MT. (NOTE) SARS-CoV-2 target nucleic acids are DETECTED. The SARS-CoV-2 RNA is generally detectable in upper and lower respiratory specimens during the acute phase of infection. Positive results are indicative of active infection with SARS-CoV-2. Clinical  correlation with patient history and other diagnostic information is necessary to determine patient infection status. Positive results do  not rule out bacterial infection or co-infection with other viruses. The expected result is Negative. Fact Sheet for Patients: SugarRoll.be Fact Sheet for Healthcare Providers: https://www.woods-mathews.com/ This test is not yet approved or cleared by the [place] FDA  and  has been authorized for detection and/or diagnosis of SARS-CoV-2 by FDA under an Emergency Use Authorization (EUA). This EUA will remain  in effect (meaning  this test can be used) for the duration of the COVID-19 declaration under Section 564(b)(1) of the Act, 21 U.S.C. section 360bbb-3(b)(1), unless the authorization is terminated or revoked sooner. Performed at [facility], [place] [address] [place], [place] [address]   Comprehensive metabolic panel     Status: Abnormal   Collection Time: 04/24/19  5:45 AM  Result Value Ref Range   Sodium 139 135 - 145 mmol/L   Potassium 4.0 3.5 - 5.1 mmol/L   Chloride 94 (L) 98 - 111 mmol/L   CO2 26 22 - 32 mmol/L   Glucose, Bld 127 (H) 70 - 99 mg/dL   BUN 30 (H) 8 - 23 mg/dL   Creatinine, Ser 6.40 (H) 0.61 - 1.24 mg/dL   Calcium 8.1 (L) 8.9 - 10.3 mg/dL   Total Protein 6.1 (L) 6.5 - 8.1 g/dL   Albumin 2.9 (L) 3.5 - 5.0 g/dL   AST 46 (H) 15 - 41 U/L   ALT 30 0 - 44 U/L   Alkaline Phosphatase 85 38 - 126 U/L   Total Bilirubin 1.0 0.3 - 1.2 mg/dL   GFR calc non Af Amer 7 (L) >60 mL/min   GFR calc Af Amer 8 (L) >60 mL/min  Anion gap 19 (H) 5 - 15    Comment: Performed at [facility], [place] [address], [place], [place] [address]  CBC     Status: Abnormal   Collection Time: 04/24/19  9:08 AM  Result Value Ref Range   WBC 3.1 (L) 4.0 - 10.5 K/uL   RBC 3.79 (L) 4.22 - 5.81 MIL/uL   Hemoglobin 11.7 (L) 13.0 - 17.0 g/dL    Comment: REPEATED TO VERIFY DELTA CHECK NOTED    HCT 38.2 (L) 39.0 - 52.0 %   MCV 100.8 (H) 80.0 - 100.0 fL   MCH 30.9 26.0 - 34.0 pg   MCHC 30.6 30.0 - 36.0 g/dL   RDW 14.3 11.5 - 15.5 %   Platelets 162 150 - 400 K/uL    Comment: REPEATED TO VERIFY DELTA CHECK NOTED    nRBC 0.0 0.0 - 0.2 %    Comment: Performed at [facility], [place] [address], [place], [place] [address]     ROS: as in HPI  Physical Exam: Vitals:   04/24/19 0900 04/24/19 1030  BP: (!) 123/98 (!) 106/57  Pulse: 69 (!) 36   Resp: 17 17  Temp:    SpO2: 96% 100%     Physical Exam: Patient is not personally examined.  Reviewed notes with physical exam from other providers.  This is in effort to preserve PPE and to limit exposure to all providers   Dialysis Orders: Center: [facility] . EDW 76.5HD Bath 2k, 2.25ca  Time 4hr Heparin 3000. Access RUA AVF     Calcitriol  0.75 mcg po /HD  MIrcera 30 mcg q 2wks hd ( last given 04/23/19 )  Venofer  50 mg q wkly     Assessment/Plan  1. Acute Hypoxemic Resp Failure 2/2 COVID- Management per admit  2. ESRD -HD  Normal schedule TTS - no hd needs today ,with lab and vol ok  plan for hd tomor  And back on TTS later.  Of note-  Dialysis nursing staff overwhelmed by COVID patients which require a one on one separate treatment.  Orders will be placed for tomorrow-  Cannot guarantee timing  3. Hypertension/volume  - bp stable  , no excess volume  With current notes / was on home med Lopressor 25mg  bid  4. Anemia of ESRD  - hgb 11.7  No esa needs  Fu hgb trend /Mircera  given 11/18 5. Metabolic bone disease -  Po vit d on hd  And renvela binder / sensipar    Ernest Haber, PA-C [place] [phone] 04/24/2019, 11:10 AM   Patient seen and examined, agree with above note with above modifications. Elderly HD patient with many family members having [place].  Developed sxms that require inpatient management.  Will plan to support with HD while here as able-  No needs at present  Corliss Parish, MD 04/24/2019

## 2019-04-24 NOTE — ED Notes (Signed)
Son's name is Helmer Slover

## 2019-04-24 NOTE — ED Notes (Signed)
Soft dinner tray ordered 

## 2019-04-24 NOTE — ED Notes (Signed)
PUI/tele   Breakfast ordered

## 2019-04-24 NOTE — H&P (Signed)
Date: 04/24/2019               Patient Name:  Timothy Carney MRN: [id number]  DOB: 06/02/1926 Age / Sex: 85 y.o., male   PCP: [name], MD         Medical Service: Internal Medicine Teaching Service         Attending Physician: Dr. Isla Pence, MD    First Contact: Dr. Court Joy Pager: [phone]  Second Contact: Dr. Koleen Distance Pager: [phone]       After Hours (After 5p/  First Contact Pager: [phone]  weekends / holidays): Second Contact Pager: [phone]   Chief Complaint: SOB  History of Present Illness: Mr. Herriott is a 85 year old male with past medical history significant for ESRD (HD MWF), GERD, HTN, HLD, SSS s/p pacemaker, BPH, gout, anxiety and anemia who presented to the ED with fever (101.5), body aches, cough and shortness of breath. The patient states that his symptoms started over the last few days but had acutely worsened over yesterday. Pt had increasing SOB and was requiring supplemental O2 during his dialysis session yesterday. When he finished and the oxygen was removed, the patient continued to have SOB and increased respiratory effort. He was then encouraged to come to the ED. He reports that his cough is dry and nonproductive, has subjective fevers, and intense body aches.  He describes the body aches "as if my bones are breaking". He also feels fatigued and weak. Daughter reported that he had nausea, vomiting, and diarrhea a few days ago but that had resolved. Denies having any chest pain or numbness/tingling of any extremities.   Of note, the patient's daughter, [name], reported to the ED that her brother is a Curator in [place] was recently diagnosed with COVID-19.  He has been visiting the patient every day for the past week, and was not displaying any symptoms until this week when he got tested.   In the ED, a CBC, BMP, D-dimer, ferritin, fibrinogen, lactate, LDH, procalcitonin, CRP, and blood cultures blood cultures were drawn. Patient was notable  to have leukopenia to 1.8, and elevated D-dimer to 2.15, and elevated fibrinogen of 482. Procalcitonin was normal. Other labs are currently pending. Chest x-ray was notable for bilateral airspace opacities most compatible with multifocal pneumonia. He was started on vancomycin and cefepime, received a dose of methylprednisolone 125 mg and an 8 albuterol puffs.  Meds:  No outpatient medications have been marked as taking for the 04/23/19 encounter [facility] Encounter).   Allergies: Allergies as of 04/23/2019   (No Known Allergies)   Past Medical History:  Diagnosis Date   Anemia    Anxiety    Takes Ativan   Benign hypertension 01/06/2016   Blood transfusion 2012   Chronic kidney disease    FOLLOWED BY DR Moshe Cipro   Chronic kidney disease, stage 5, kidney failure ([place]) 01/06/2016   managed NEPH 2016: 12 2017: dialysis   Chronic pain syndrome 05/16/2016   2016: onset 2017: opiates   Diverticulitis 2012   Diverticulosis of colon 01/06/2016   2016: diverticulitis   End stage renal disease ([place]) 07/13/2011   GERD (gastroesophageal reflux disease)    Gout 01/06/2016   Hyperlipidemia, mixed 01/06/2016   Hypertension    takes Metoprolol but pt states it makes blood pressure drop;    Mechanical complication of other vascular device, implant, and graft 09/21/2011   Pacemaker 07/12/10   Dual Chamber Pacemaker ( Dr. Agustin Cree at [facility])  Pneumonia April 2012   PSVT (paroxysmal supraventricular tachycardia) ([place]) 01/06/2016   Secondary hyperparathyroidism ([place])    Sick sinus syndrome ([place]) 01/06/2016   pacemaker   Family History: Family History  Problem Relation Age of Onset   Heart disease Mother    Rheum arthritis Mother    Hypertension Father    Stroke Father    Hypertension Daughter    Hypertension Son    Rheum arthritis Sister    Diabetes Sister    Hypertension Sister    Hypertension Brother    Anesthesia problems Neg Hx    Hypotension Neg  Hx    Malignant hyperthermia Neg Hx    Pseudochol deficiency Neg Hx    Social History: Social History   Socioeconomic History   Marital status: Married    Spouse name: Not on file   Number of children: Not on file   Years of education: Not on file   Highest education level: Not on file  Occupational History   Not on file  Social Needs   Financial resource strain: Not on file   Food insecurity    Worry: Not on file    Inability: Not on file   Transportation needs    Medical: Not on file    Non-medical: Not on file  Tobacco Use   Smoking status: Former Smoker    Packs/day: 1.50    Years: 20.00    Pack years: 30.00    Types: Cigarettes    Quit date: 11/16/1959    Years since quitting: 59.4   Smokeless tobacco: Never Used  Substance and Sexual Activity   Alcohol use: No   Drug use: No   Sexual activity: Not on file  Lifestyle   Physical activity    Days per week: Not on file    Minutes per session: Not on file   Stress: Not on file  Relationships   Social connections    Talks on phone: Not on file    Gets together: Not on file    Attends religious service: Not on file    Active member of club or organization: Not on file    Attends meetings of clubs or organizations: Not on file    Relationship status: Not on file   Intimate partner violence    Fear of current or ex partner: Not on file    Emotionally abused: Not on file    Physically abused: Not on file    Forced sexual activity: Not on file  Other Topics Concern   Not on file  Social History Narrative   Not on file   - Patient's son works in [employer] and has been visiting him every day for the last week. Developed symptoms and subsequently tested positive for COVID-19  Imaging: EKG: Ventricular bigeminy Left axis deviation  CXR:  IMPRESSION: Bilateral airspace opacities most compatible with multifocal  pneumonia. Followup PA and lateral chest X-ray is recommended in  3-4 weeks following trial of antibiotic therapy to ensure resolution and exclude underlying malignancy.  Review of Systems: All systems were reviewed and are otherwise negative unless mentioned in the HPI.  Physical Exam: Blood pressure 138/60, pulse 84, temperature 98.5 F (36.9 C), temperature source Oral, resp. rate (!) 22, SpO2 95 %.  Physical Exam Vitals signs reviewed.  Constitutional:      Appearance: He is ill-appearing.     Comments: Tired appearing elderly male  HENT:     Head: Normocephalic and atraumatic.  Cardiovascular:  Rate and Rhythm: Normal rate. Rhythm irregular.     Heart sounds: Murmur (Blowing systolic murmur appreciated best at the right upper sternal border) present. No friction rub. No gallop.   Pulmonary:     Breath sounds: Wheezing (Expiratory) and rales present.     Comments: Mild increased work of breathing on 4 L of supplemental O2 Abdominal:     General: Abdomen is flat. Bowel sounds are normal. There is no distension.     Palpations: Abdomen is soft.     Tenderness: There is no abdominal tenderness. There is no guarding.  Musculoskeletal:        General: No swelling or deformity.     Right lower leg: No edema.     Left lower leg: No edema.  Skin:    Comments: Cool and clammy to touch  Neurological:     General: No focal deficit present.     Mental Status: He is alert and oriented to person, place, and time.  Psychiatric:        Mood and Affect: Mood normal.    Assessment & Plan by Problem: Active Problems:   Acute respiratory failure with hypoxemia (HCC)  In summary, Mr. Mastalski is a 85 year old male with past medical history significant for ESRD (HD MWF), GERD, HTN, HLD, SSS s/p pacemaker, BPH, gout, anxiety and anemia who presented to the ED with fever (101.5), body aches, cough, shortness of breath, a chest x-ray consistent with multifocal pneumonia, and lab findings suggesting COVID-19 infection in the context of known exposure via  family member.  #Fever #SOB #Increased O2 requirement: The patient's constellation of symptoms of increased supplemental O2 requirement, fevers (101.5), body aches, cough as well as lab findings of leukopenia to 2.1, elevated D-dimer and fibrinogen, and lastly his son, who is a Curator, visiting him daily prior to COVID-19 diagnosis are highly suggestive of COVID-19. Patient is already received a dose of vancomycin, cefepime and methylprednisolone 125 mg in the ED. - D/c vancomycin and cefepime - Start remdesivir 200 mg today followed by 100 mg daily for 5 days - Dexamethasone 6 mg daily starting tomorrow - Continue supplemental O2 support - Airborne precautions - Follow-up COVID-19 swab results - Follow-up blood cultures  #Thrombocytopenia: Platelet count decreased to 74 on admission.  No prior CBCs to compare to. - Monitor with daily CBCs  #ESRD (HD MWF): Last dialysis session was morning of 11/18, next due Friday, 11/20. Current BUN and creatinine 25 and 5.97. - Continue HD MWF  - Renvela 800 mg daily - Cinacalcet 30 mg daily - Daily BMP   #Hx SSS s/p pacemaker #HTN - Metoprolol 12.5 mg daily - Aspirin 81 mg daily  #BPH -Flomax 0.4 mg twice daily  #Hx of Gout -Allopurinol 200 mg daily  #FEN/GI - Diet: Soft renal - Fluids: None  #DVT prophylaxis - Heparin subcu injections 5000 units q8hrs  #CODE STATUS: FULL.  Daughter, [name] [phone]) is POA  #Dispo: Admit patient to Inpatient with expected length of stay greater than 2 midnights.  Signed: Earlene Plater, MD Internal Medicine, PGY1 Pager: [phone]  04/24/2019,2:57 AM

## 2019-04-25 ENCOUNTER — Other Ambulatory Visit: Payer: Self-pay

## 2019-04-25 DIAGNOSIS — R140 Abdominal distension (gaseous): Secondary | ICD-10-CM

## 2019-04-25 DIAGNOSIS — I495 Sick sinus syndrome: Secondary | ICD-10-CM

## 2019-04-25 DIAGNOSIS — Z950 Presence of cardiac pacemaker: Secondary | ICD-10-CM

## 2019-04-25 DIAGNOSIS — R251 Tremor, unspecified: Secondary | ICD-10-CM

## 2019-04-25 MED ORDER — SIMETHICONE 80 MG PO CHEW: 160.0000 mg | CHEWABLE_TABLET | Freq: Four times a day (QID) | ORAL | Status: DC | PRN

## 2019-04-25 NOTE — ED Notes (Signed)
Pt received lunch tray. RN helped pt call his daughter.

## 2019-04-25 NOTE — ED Notes (Signed)
Lunch Tray Order @ 1012.  

## 2019-04-25 NOTE — ED Notes (Signed)
This RN spoke to pt's son, Addison Schartner, called for an update. [phone]

## 2019-04-25 NOTE — ED Notes (Signed)
Tele   Breakfast ordered  

## 2019-04-25 NOTE — Progress Notes (Signed)
Daily progress Note   Pt was admitted 1830 noted yellow MEWS score due to pulse of 128 @ 1900 patient pulse 113. Patient was noted to be shaking at rest and with movement. Pt stated to have a history of shaking prior to HD. Patient in bed with bed alarm on.

## 2019-04-25 NOTE — ED Notes (Signed)
ED TO INPATIENT HANDOFF REPORT  ED Nurse Name and Phone #: Wilmer Floor [phone]  S Name/Age/Gender Timothy Carney 85 y.o. male Room/Bed: 021C/021C  Code Status   Code Status: Full Code  Home/SNF/Other Home Patient oriented to: self, place, time and situation Is this baseline? Yes   Triage Complete: Triage complete  Chief Complaint COVID SX, Dialysis Patient  Triage Note BIB EMS from dialysis. Received all but last 30 mins of treatment. C/O dry cough, N/V, weakness x 3 days. Temp 99.4 prior to dialysis treatment. Tylenol given at 1730.    Allergies No Known Allergies  Level of Care/Admitting Diagnosis ED Disposition    ED Disposition Condition [facility] Area: [place] [[id number]]  Level of Care: Telemetry Medical [104]  Covid Evaluation: Confirmed COVID Positive  Diagnosis: Acute respiratory failure with hypoxia [id number]  Admitting Physician: Bosie Helper  Attending Physician: Lucious Groves [[id number]]  Estimated length of stay: past midnight tomorrow  Certification:: I certify this patient will need inpatient services for at least 2 midnights  PT Class (Do Not Modify): Inpatient [101]  PT Acc Code (Do Not Modify): Private [1]       B Medical/Surgery History Past Medical History:  Diagnosis Date  . Anemia   . Anxiety    Takes Ativan  . Benign hypertension 01/06/2016  . Blood transfusion 2012  . Chronic kidney disease    FOLLOWED BY DR Moshe Cipro  . Chronic kidney disease, stage 5, kidney failure ([place]) 01/06/2016   managed NEPH 2016: 12 2017: dialysis  . Chronic pain syndrome 05/16/2016   2016: onset 2017: opiates  . Diverticulitis 2012  . Diverticulosis of colon 01/06/2016   2016: diverticulitis  . End stage renal disease ([place]) 07/13/2011  . GERD (gastroesophageal reflux disease)   . Gout 01/06/2016  . Hyperlipidemia, mixed 01/06/2016  . Hypertension    takes Metoprolol but pt states it makes blood pressure drop;    Marland Kitchen Mechanical complication of other vascular device, implant, and graft 09/21/2011  . Pacemaker 07/12/10   Dual Chamber Pacemaker ( Dr. Agustin Cree at [facility])  . Pneumonia April 2012  . PSVT (paroxysmal supraventricular tachycardia) ([place]) 01/06/2016  . Secondary hyperparathyroidism ([place])   . Sick sinus syndrome ([place]) 01/06/2016   pacemaker   Past Surgical History:  Procedure Laterality Date  . AV FISTULA PLACEMENT  07/17/2011   Procedure: ARTERIOVENOUS (AV) FISTULA CREATION;  Surgeon: Elam Dutch, MD;  Location[facility];  Service: Vascular;  Laterality: Right;  . AV FISTULA PLACEMENT  10/04/2011   Procedure: ARTERIOVENOUS (AV) FISTULA CREATION;  Surgeon: Elam Dutch, MD;  Location: [place];  Service: Vascular;;  . Lillard Anes    . HERNIA REPAIR    . INSERT / REPLACE / REMOVE PACEMAKER  07/2010  . PROSTATE SURGERY       A IV Location/Drains/Wounds Patient Lines/Drains/Airways Status   Active Line/Drains/Airways    Name:   Placement date:   Placement time:   Site:   Days:   Peripheral IV 10/04/11 Left Hand   10/04/11    1004    Hand   2760   Peripheral IV 04/24/19   04/24/19    0024    -   1   Peripheral IV 04/24/19 Left Antecubital   04/24/19    0036    Antecubital   1   Vascular Access Right Other (Comment) Arteriovenous fistula   07/17/11    0818  Other (Comment)   2839   Vascular Access Right Upper arm Arteriovenous fistula   10/04/11    1220    Upper arm   2760   Incision 07/17/11 Other (Comment)   07/17/11    0852     2839   Incision 10/04/11 Arm Right   10/04/11    1115     2760   Incision 10/04/11 Arm Right   10/04/11    1237     2760          Intake/Output Last 24 hours  Intake/Output Summary (Last 24 hours) at 04/25/2019 1801 Last data filed at 04/25/2019 1131 Gross per 24 hour  Intake 250 ml  Output -  Net 250 ml    Labs/Imaging Results for orders placed or performed during the hospital encounter of 04/23/19 (from the past 48 hour(s))  Lactic  acid, plasma     Status: None   Collection Time: 04/24/19 12:19 AM  Result Value Ref Range   Lactic Acid, Venous 1.2 0.5 - 1.9 mmol/L    Comment: Performed at [facility], [address] [place], [place] [address]  CBC WITH DIFFERENTIAL     Status: Abnormal   Collection Time: 04/24/19 12:19 AM  Result Value Ref Range   WBC 1.8 (L) 4.0 - 10.5 K/uL   RBC 5.03 4.22 - 5.81 MIL/uL   Hemoglobin 15.8 13.0 - 17.0 g/dL   HCT 49.4 39.0 - 52.0 %   MCV 98.2 80.0 - 100.0 fL   MCH 31.4 26.0 - 34.0 pg   MCHC 32.0 30.0 - 36.0 g/dL   RDW 14.3 11.5 - 15.5 %   Platelets 74 (L) 150 - 400 K/uL    Comment: REPEATED TO VERIFY PLATELET COUNT CONFIRMED BY SMEAR Immature Platelet Fraction may be clinically indicated, consider ordering this additional test [id number]    nRBC 0.0 0.0 - 0.2 %   Neutrophils Relative % 75 %   Neutro Abs 1.4 (L) 1.7 - 7.7 K/uL   Lymphocytes Relative 15 %   Lymphs Abs 0.3 (L) 0.7 - 4.0 K/uL   Monocytes Relative 8 %   Monocytes Absolute 0.2 0.1 - 1.0 K/uL   Eosinophils Relative 0 %   Eosinophils Absolute 0.0 0.0 - 0.5 K/uL   Basophils Relative 1 %   Basophils Absolute 0.0 0.0 - 0.1 K/uL   Immature Granulocytes 1 %   Abs Immature Granulocytes 0.02 0.00 - 0.07 K/uL    Comment: Performed at [facility], [place] [address] [place], [place] [address]  Comprehensive metabolic panel     Status: Abnormal   Collection Time: 04/24/19 12:19 AM  Result Value Ref Range   Sodium 137 135 - 145 mmol/L   Potassium 3.6 3.5 - 5.1 mmol/L   Chloride 93 (L) 98 - 111 mmol/L   CO2 27 22 - 32 mmol/L   Glucose, Bld 101 (H) 70 - 99 mg/dL   BUN 25 (H) 8 - 23 mg/dL   Creatinine, Ser 5.97 (H) 0.61 - 1.24 mg/dL   Calcium 8.0 (L) 8.9 - 10.3 mg/dL   Total Protein 6.0 (L) 6.5 - 8.1 g/dL   Albumin 3.2 (L) 3.5 - 5.0 g/dL   AST 53 (H) 15 - 41 U/L   ALT 32 0 - 44 U/L   Alkaline Phosphatase 90 38 - 126 U/L   Total Bilirubin 0.9 0.3 - 1.2 mg/dL   GFR calc non Af Amer 8 (L) >60 mL/min   GFR  calc Af Amer 9 (L) >60 mL/min   Anion gap 17 (H) 5 - 15    Comment: Performed at [facility], [place] [address], [place], [place] [address]  D-dimer, quantitative     Status: Abnormal   Collection Time: 04/24/19 12:19 AM  Result Value Ref Range   D-Dimer, Quant 2.15 (H) 0.00 - 0.50 ug/mL-FEU    Comment: (NOTE) At the manufacturer cut-off of 0.50 ug/mL FEU, this assay has been documented to exclude PE with a sensitivity and negative predictive value of 97 to 99%.  At this time, this assay has not been approved by the FDA to exclude DVT/VTE. Results should be correlated with clinical presentation. Performed at [facility], [place] [address], [place], [place] [address]   Procalcitonin     Status: None   Collection Time: 04/24/19 12:19 AM  Result Value Ref Range   Procalcitonin 0.85 ng/mL    Comment:        Interpretation: PCT > 0.5 ng/mL and <= 2 ng/mL: Systemic infection (sepsis) is possible, but other conditions are known to elevate PCT as well. (NOTE)       Sepsis PCT Algorithm           Lower Respiratory Tract                                      Infection PCT Algorithm    ----------------------------     ----------------------------         PCT < 0.25 ng/mL                PCT < 0.10 ng/mL         Strongly encourage             Strongly discourage   discontinuation of antibiotics    initiation of antibiotics    ----------------------------     -----------------------------       PCT 0.25 - 0.50 ng/mL            PCT 0.10 - 0.25 ng/mL               OR       >80% decrease in PCT            Discourage initiation of                                            antibiotics      Encourage discontinuation           of antibiotics    ----------------------------     -----------------------------         PCT >= 0.50 ng/mL              PCT 0.26 - 0.50 ng/mL                AND       <80% decrease in PCT             Encourage initiation of                                              antibiotics  Encourage continuation           of antibiotics    ----------------------------     -----------------------------        PCT >= 0.50 ng/mL                  PCT > 0.50 ng/mL               AND         increase in PCT                  Strongly encourage                                      initiation of antibiotics    Strongly encourage escalation           of antibiotics                                     -----------------------------                                           PCT <= 0.25 ng/mL                                                 OR                                        > 80% decrease in PCT                                     Discontinue / Do not initiate                                             antibiotics Performed at [facility], [address], [place], [place] [address]   Lactate dehydrogenase     Status: Abnormal   Collection Time: 04/24/19 12:19 AM  Result Value Ref Range   LDH 388 (H) 98 - 192 U/L    Comment: Performed at [facility] [address] [place], [place] [address]  Ferritin     Status: Abnormal   Collection Time: 04/24/19 12:19 AM  Result Value Ref Range   Ferritin 4,332 (H) 24 - 336 ng/mL    Comment: Performed at [facility], [place] [address], [place], [place] [address]  Triglycerides     Status: None   Collection Time: 04/24/19 12:19 AM  Result Value Ref Range   Triglycerides 98 <150 mg/dL    Comment: Performed at [facility] [address] , [place] [address]  Fibrinogen     Status: Abnormal   Collection Time: 04/24/19 12:19 AM  Result Value Ref Range   Fibrinogen 482 (H) 210 - 475 mg/dL  Comment: Performed at [facility], [place] [address] [place], [place] [address]  C-reactive protein     Status: Abnormal   Collection Time: 04/24/19 12:19 AM  Result Value Ref Range   CRP 12.9 (H) <1.0 mg/dL    Comment: Performed at [facility] [address] [place],  [place] [address]  Blood Culture (routine x 2)     Status: None (Preliminary result)   Collection Time: 04/24/19 12:21 AM   Specimen: BLOOD LEFT HAND  Result Value Ref Range   Specimen Description BLOOD LEFT HAND    Special Requests      BOTTLES DRAWN AEROBIC AND ANAEROBIC Blood Culture results may not be optimal due to an inadequate volume of blood received in culture bottles   Culture      NO GROWTH 1 DAY Performed at [facility], [place] [address], [place], [place] [address]    Report Status PENDING   Blood Culture (routine x 2)     Status: None (Preliminary result)   Collection Time: 04/24/19 12:32 AM   Specimen: BLOOD  Result Value Ref Range   Specimen Description BLOOD LEFT ANTECUBITAL    Special Requests      BOTTLES DRAWN AEROBIC AND ANAEROBIC Blood Culture adequate volume   Culture      NO GROWTH 1 DAY Performed at [facility], [place] [address], [place], [place] [address]    Report Status PENDING   SARS CORONAVIRUS 2 (TAT 6-24 HRS) Nasopharyngeal Nasopharyngeal Swab     Status: Abnormal   Collection Time: 04/24/19 12:43 AM   Specimen: Nasopharyngeal Swab  Result Value Ref Range   SARS Coronavirus 2 POSITIVE (A) NEGATIVE    Comment: RESULT CALLED TO, READ BACK BY AND VERIFIED WITH: Bethena Midget, [facility] ED CHARGE RN AT [phone] ON 04/24/19 BY C. JESSUP, MT. (NOTE) SARS-CoV-2 target nucleic acids are DETECTED. The SARS-CoV-2 RNA is generally detectable in upper and lower respiratory specimens during the acute phase of infection. Positive results are indicative of active infection with SARS-CoV-2. Clinical  correlation with patient history and other diagnostic information is necessary to determine patient infection status. Positive results do  not rule out bacterial infection or co-infection with other viruses. The expected result is Negative. Fact Sheet for Patients: SugarRoll.be Fact Sheet for Healthcare  Providers: https://www.woods-mathews.com/ This test is not yet approved or cleared by the [place] FDA and  has been authorized for detection and/or diagnosis of SARS-CoV-2 by FDA under an Emergency Use Authorization (EUA). This EUA will remain  in effect (meaning  this test can be used) for the duration of the COVID-19 declaration under Section 564(b)(1) of the Act, 21 U.S.C. section 360bbb-3(b)(1), unless the authorization is terminated or revoked sooner. Performed at [facility], [place] [address] [place],  [address]   Comprehensive metabolic panel     Status: Abnormal   Collection Time: 04/24/19  5:45 AM  Result Value Ref Range   Sodium 139 135 - 145 mmol/L   Potassium 4.0 3.5 - 5.1 mmol/L   Chloride 94 (L) 98 - 111 mmol/L   CO2 26 22 - 32 mmol/L   Glucose, Bld 127 (H) 70 - 99 mg/dL   BUN 30 (H) 8 - 23 mg/dL   Creatinine, Ser 6.40 (H) 0.61 - 1.24 mg/dL   Calcium 8.1 (L) 8.9 - 10.3 mg/dL   Total Protein 6.1 (L) 6.5 - 8.1 g/dL   Albumin 2.9 (L) 3.5 - 5.0 g/dL  AST 46 (H) 15 - 41 U/L   ALT 30 0 - 44 U/L   Alkaline Phosphatase 85 38 - 126 U/L   Total Bilirubin 1.0 0.3 - 1.2 mg/dL   GFR calc non Af Amer 7 (L) >60 mL/min   GFR calc Af Amer 8 (L) >60 mL/min   Anion gap 19 (H) 5 - 15    Comment: Performed at [facility] [address] [place], [place] [address]  CBC     Status: Abnormal   Collection Time: 04/24/19  9:08 AM  Result Value Ref Range   WBC 3.1 (L) 4.0 - 10.5 K/uL   RBC 3.79 (L) 4.22 - 5.81 MIL/uL   Hemoglobin 11.7 (L) 13.0 - 17.0 g/dL    Comment: REPEATED TO VERIFY DELTA CHECK NOTED    HCT 38.2 (L) 39.0 - 52.0 %   MCV 100.8 (H) 80.0 - 100.0 fL   MCH 30.9 26.0 - 34.0 pg   MCHC 30.6 30.0 - 36.0 g/dL   RDW 14.3 11.5 - 15.5 %   Platelets 162 150 - 400 K/uL    Comment: REPEATED TO VERIFY DELTA CHECK NOTED    nRBC 0.0 0.0 - 0.2 %    Comment: Performed at [facility], [place] [address], [place], [place] [address]   Dg  Chest Port 1 View  Result Date: 04/23/2019 CLINICAL DATA:  Shortness of breath, fever EXAM: PORTABLE CHEST 1 VIEW COMPARISON:  11/26/2015 FINDINGS: Left pacer remains in place, unchanged. Low lung volumes. Airspace disease noted in the right upper lobe, left upper lobe and right perihilar region. This likely reflects pneumonia. Heart is borderline in size. No acute bony abnormality. IMPRESSION: Bilateral airspace opacities most compatible with multifocal pneumonia. Followup PA and lateral chest X-ray is recommended in 3-4 weeks following trial of antibiotic therapy to ensure resolution and exclude underlying malignancy. Electronically Signed   By: Rolm Baptise M.D.   On: 04/23/2019 22:53    Pending Labs Unresulted Labs (From admission, onward)    Start     Ordered   04/23/19 2239  Lactic acid, plasma  Now then every 2 hours,   STAT     04/23/19 2238          Vitals/Pain Today's Vitals   04/25/19 1500 04/25/19 1505 04/25/19 1555 04/25/19 1757  BP: 130/79     Pulse: 85   86  Resp: 16   17  Temp:      TempSrc:      SpO2: 97%   97%  PainSc:  0-No pain 0-No pain     Isolation Precautions Airborne and Contact precautions  Medications Medications  allopurinol (ZYLOPRIM) tablet 200 mg (200 mg Oral Given 04/25/19 1006)  aspirin EC tablet 81 mg (81 mg Oral Given 04/25/19 1007)  metoprolol tartrate (LOPRESSOR) tablet 12.5 mg (12.5 mg Oral Not Given 04/24/19 2202)  cinacalcet (SENSIPAR) tablet 30 mg (30 mg Oral Given 04/25/19 1136)  sevelamer carbonate (RENVELA) tablet 1,600 mg (1,600 mg Oral Given 04/25/19 1135)  tamsulosin (FLOMAX) capsule 0.4 mg (0.4 mg Oral Given 04/25/19 1005)  heparin injection 5,000 Units (5,000 Units Subcutaneous Given 04/25/19 0507)  sodium chloride flush (NS) 0.9 % injection 3 mL (3 mLs Intravenous Given 04/25/19 1009)  acetaminophen (TYLENOL) tablet 650 mg (650 mg Oral Given 04/25/19 1134)    Or  acetaminophen (TYLENOL) suppository 650 mg ( Rectal See  Alternative 04/25/19 1134)  senna-docusate (Senokot-S) tablet 1 tablet (has no administration in time range)  ondansetron [facility]) tablet 4 mg (4 mg Oral Given 04/25/19 1132)    Or  ondansetron (ZOFRAN) injection 4 mg ( Intravenous See Alternative 04/25/19 1132)  remdesivir 200 mg in sodium chloride 0.9 % 250 mL IVPB (0 mg Intravenous Stopped 04/24/19 0701)    Followed by  remdesivir 100 mg in sodium chloride 0.9 % 250 mL IVPB (0 mg Intravenous Stopped 04/25/19 1131)  dexamethasone (DECADRON) tablet 6 mg (6 mg Oral Given 04/25/19 1006)  multivitamin (RENA-VIT) tablet 1 tablet (1 tablet Oral Given 04/24/19 2210)  calcitRIOL (ROCALTROL) capsule 0.75 mcg (has no administration in time range)  ferric gluconate (NULECIT) 62.5 mg in sodium chloride 0.9 % 100 mL IVPB (has no administration in time range)  simethicone (MYLICON) chewable tablet 160 mg (has no administration in time range)  albuterol (VENTOLIN HFA) 108 (90 Base) MCG/ACT inhaler 8 puff (8 puffs Inhalation Given 04/24/19 0042)  methylPREDNISolone sodium succinate (SOLU-MEDROL) 125 mg/2 mL injection 125 mg (125 mg Intravenous Given 04/24/19 0024)  vancomycin (VANCOCIN) 1,750 mg in sodium chloride 0.9 % 500 mL IVPB (0 mg Intravenous Stopped 04/24/19 0255)  acetaminophen (TYLENOL) tablet 1,000 mg (1,000 mg Oral Given 04/24/19 0022)    Mobility non-ambulatory     Focused Assessments Cardiac Assessment Handoff:    No results found for: CKTOTAL, CKMB, CKMBINDEX, TROPONINI Lab Results  Component Value Date   DDIMER 2.15 (H) 04/24/2019   Does the Patient currently have chest pain? No  , Neuro Assessment Handoff:  Swallow screen pass? Yes          Neuro Assessment:   Neuro Checks:      Last Documented NIHSS Modified Score:   Has TPA been given? No If patient is a Neuro Trauma and patient is going to OR before floor call report to [place] nurse: [phone] or [phone]  , Renal Assessment Handoff:  Hemodialysis  Schedule: Hemodialysis Schedule: Monday/Wednesday/Friday Last Hemodialysis date and time: 11/18   Restricted appendage: right arm  , Pulmonary Assessment Handoff:  Lung sounds:   O2 Device: Nasal Cannula O2 Flow Rate (L/min): 4 L/min      R Recommendations: See Admitting Provider Note  Report given to:   Additional Notes:

## 2019-04-25 NOTE — ED Notes (Signed)
Pt's daughter, Deforest Hoyles, called for update. Wants to be notified when pt moves rooms.

## 2019-04-25 NOTE — Progress Notes (Signed)
Subjective:  Remains in ER overnight- hemodynamically stable-  Good O2 sats- unknown O2 req  Objective Vital signs in last 24 hours: Vitals:   04/25/19 1000 04/25/19 1015 04/25/19 1030 04/25/19 1115  BP: (!) 116/92 (!) 133/105 (!) 142/54 118/72  Pulse: 86 79 85 89  Resp:  15 17 17   Temp:      TempSrc:      SpO2: 97% 95% 99% 99%   Weight change:   Intake/Output Summary (Last 24 hours) at 04/25/2019 1147 Last data filed at 04/25/2019 1131 Gross per 24 hour  Intake 250 ml  Output -  Net 250 ml    Dialysis Orders: Center: [facility] . EDW 76.5HD Bath 2k, 2.25ca  Time 4hr Heparin 3000. Access RUA AVF     Calcitriol  0.75 mcg po /HD  MIrcera 30 mcg q 2wks hd ( last given 04/23/19 )  Venofer  50 mg q wkly     Assessment/Plan  1. Acute Hypoxemic Resp Failure 2/2 COVID- Management per admit - decadron and remdesivir 2. ESRD -HD  Normal schedule TTS - no hd needs today ,  Had HD on Wed 11/18 as OP on PUI shift.  with lab and vol ok  plan for hd today ?   And back on TTS later.    Dialysis nursing staff overwhelmed by COVID patients which require a one on one separate treatment.  Orders will be placed for today-  Cannot guarantee timing - cannot get HD until located in a patient room  3. Hypertension/volume  - bp stable  , no excess volume  With current notes / was on home med Lopressor 25mg  bid - planning on moderate goal with HD today when done  4. Anemia of ESRD  - hgb 11.7  No esa needs  Fu hgb trend /Mircera  given 11/18 5. Metabolic bone disease -  Po vit d on hd  And renvela binder / sensipar     Louis Meckel    Labs: Basic Metabolic Panel: Recent Labs  Lab 04/24/19 0019 04/24/19 0545  NA 137 139  K 3.6 4.0  CL 93* 94*  CO2 27 26  GLUCOSE 101* 127*  BUN 25* 30*  CREATININE 5.97* 6.40*  CALCIUM 8.0* 8.1*   Liver Function Tests: Recent Labs  Lab 04/24/19 0019 04/24/19 0545  AST 53* 46*  ALT 32 30  ALKPHOS 90 85  BILITOT 0.9 1.0  PROT 6.0*  6.1*  ALBUMIN 3.2* 2.9*   No results for input(s): LIPASE, AMYLASE in the last 168 hours. No results for input(s): AMMONIA in the last 168 hours. CBC: Recent Labs  Lab 04/24/19 0019 04/24/19 0908  WBC 1.8* 3.1*  NEUTROABS 1.4*  --   HGB 15.8 11.7*  HCT 49.4 38.2*  MCV 98.2 100.8*  PLT 74* 162   Cardiac Enzymes: No results for input(s): CKTOTAL, CKMB, CKMBINDEX, TROPONINI in the last 168 hours. CBG: No results for input(s): GLUCAP in the last 168 hours.  Iron Studies:  Recent Labs    04/24/19 0019  FERRITIN 4,332*   Studies/Results: Dg Chest Port 1 View  Result Date: 04/23/2019 CLINICAL DATA:  Shortness of breath, fever EXAM: PORTABLE CHEST 1 VIEW COMPARISON:  11/26/2015 FINDINGS: Left pacer remains in place, unchanged. Low lung volumes. Airspace disease noted in the right upper lobe, left upper lobe and right perihilar region. This likely reflects pneumonia. Heart is borderline in size. No acute bony abnormality. IMPRESSION: Bilateral airspace opacities most compatible with multifocal pneumonia. Followup  PA and lateral chest X-ray is recommended in 3-4 weeks following trial of antibiotic therapy to ensure resolution and exclude underlying malignancy. Electronically Signed   By: Rolm Baptise M.D.   On: 04/23/2019 22:53   Medications: Infusions: . [START ON 04/29/2019] ferric gluconate (FERRLECIT/NULECIT) IV    . remdesivir 100 mg in NS 250 mL Stopped (04/25/19 1131)    Scheduled Medications: . allopurinol  200 mg Oral Daily  . aspirin EC  81 mg Oral Daily  . [START ON 04/26/2019] calcitRIOL  0.75 mcg Oral Q T,Th,Sa-HD  . cinacalcet  30 mg Oral Q1200  . dexamethasone  6 mg Oral Daily  . heparin  5,000 Units Subcutaneous Q8H  . metoprolol tartrate  12.5 mg Oral 2 times per day on Sun Tue Thu Sat  . multivitamin  1 tablet Oral QHS  . sevelamer carbonate  1,600 mg Oral TID WC  . sodium chloride flush  3 mL Intravenous Q12H  . tamsulosin  0.4 mg Oral BID    have  reviewed scheduled and prn medications.  Physical Exam: Full PE not performed as patient in COVID isolation.  This is order to preserve PPE and to limit exposure to all providers  04/25/2019,11:47 AM  LOS: 1 day

## 2019-04-25 NOTE — Progress Notes (Signed)
  Date: 04/25/2019  Patient name: Timothy Carney  Medical record number: [id number]  Date of birth: 06/02/1926        Subjective: feels tremulous, otherwise likes that appetite has returned, complains of abdominal gas  Objective:  Vital signs in last 24 hours: Vitals:   04/25/19 1345 04/25/19 1400 04/25/19 1430 04/25/19 1500  BP: 130/63 111/83 114/88 130/79  Pulse: 96 99 98 85  Resp: 18 19 17 16   Temp:      TempSrc:      SpO2: 98% 99% 97% 97%  General: resting in bed, on 2L supplemental O2 Cardiac: irregular rate Pulm: clear to auscultation bilaterally Abd: soft, nontender, nondistended, BS present Ext: warm and well perfused, 1+ pedal edema, mild resting tremor of left hand   Assessment/Plan:     Acute respiratory failure with hypoxemia (HCC) secondary to COVID-19 -Continue remdisivir per pharmacy 5 day course - Continue 6mg  dexamethasone- I discussed that his tremulousness is likely a side effect of this, if it becomes too bothersome we can lower dose or may need to discontinue    End stage renal disease [facility]) -nephrology following, potential for dialysis today, however needs regular hospital room    Sick sinus syndrome (HCC) -PPM  Abdominal bloating -add simethacone  Family Updates: Attempted calls to [place] but no answer (nursing notes they were updated) Dispo: deferred at this time, continues to have supplemental o2 need.  Lucious Groves, DO 04/25/2019, 4:53 PM Pager: [phone]

## 2019-04-26 LAB — RENAL FUNCTION PANEL
Albumin: 2.8 g/dL — ABNORMAL LOW (ref 3.5–5.0)
Anion gap: 18 — ABNORMAL HIGH (ref 5–15)
BUN: 78 mg/dL — ABNORMAL HIGH (ref 8–23)
CO2: 23 mmol/L (ref 22–32)
Calcium: 7.6 mg/dL — ABNORMAL LOW (ref 8.9–10.3)
Chloride: 95 mmol/L — ABNORMAL LOW (ref 98–111)
Creatinine, Ser: 10.27 mg/dL — ABNORMAL HIGH (ref 0.61–1.24)
GFR calc Af Amer: 5 mL/min — ABNORMAL LOW (ref >60)
GFR calc non Af Amer: 4 mL/min — ABNORMAL LOW (ref >60)
Glucose, Bld: 142 mg/dL — ABNORMAL HIGH (ref 70–99)
Phosphorus: 5.6 mg/dL — ABNORMAL HIGH (ref 2.5–4.6)
Potassium: 3.8 mmol/L (ref 3.5–5.1)
Sodium: 136 mmol/L (ref 135–145)

## 2019-04-26 LAB — CBC
HCT: 28.4 % — ABNORMAL LOW (ref 39.0–52.0)
Hemoglobin: 9.2 g/dL — ABNORMAL LOW (ref 13.0–17.0)
MCH: 31.5 pg (ref 26.0–34.0)
MCHC: 32.4 g/dL (ref 30.0–36.0)
MCV: 97.3 fL (ref 80.0–100.0)
Platelets: 198 10*3/uL (ref 150–400)
RBC: 2.92 MIL/uL — ABNORMAL LOW (ref 4.22–5.81)
RDW: 14 % (ref 11.5–15.5)
WBC: 8 10*3/uL (ref 4.0–10.5)
nRBC: 0 % (ref 0.0–0.2)

## 2019-04-26 MED ORDER — PHENOL 1.4 % MT LIQD: 1.0000 | OROMUCOSAL | Status: DC | PRN

## 2019-04-26 MED ORDER — GUAIFENESIN-DM 100-10 MG/5ML PO SYRP
5.0000 mL | ORAL_SOLUTION | ORAL | Status: DC | PRN
  Administered 2019-04-26 – 2019-04-30 (×2): 5 mL via ORAL
  Filled 2019-04-26 (×2): qty 5

## 2019-04-26 MED ORDER — ALPRAZOLAM 0.5 MG PO TABS
0.5000 mg | ORAL_TABLET | Freq: Two times a day (BID) | ORAL | Status: DC | PRN
  Administered 2019-04-26 – 2019-05-01 (×3): 0.5 mg via ORAL
  Filled 2019-04-26 (×3): qty 1

## 2019-04-26 NOTE — Progress Notes (Signed)
Hemodialysis treatment documented on paper flowsheets and placed in patient physical chart.

## 2019-04-26 NOTE — Progress Notes (Signed)
Subjective:  Afib and some tachy overnight-  BP soft.  On 4 L [place]-  Getting HD this AM  Objective Vital signs in last 24 hours: Vitals:   04/25/19 1500 04/25/19 1757 04/25/19 1841 04/25/19 2316  BP: 130/79  (!) 121/57 (!) 93/54  Pulse: 85 86 (!) 129 91  Resp: 16 17  20   Temp:   97.7 F (36.5 C) 97.7 F (36.5 C)  TempSrc:   Oral Oral  SpO2: 97% 97% 97% 94%   Weight change:   Intake/Output Summary (Last 24 hours) at 04/26/2019 0844 Last data filed at 04/25/2019 1131 Gross per 24 hour  Intake 250 ml  Output -  Net 250 ml    Dialysis Orders: Center: [facility] . EDW 76.5HD Bath 2k, 2.25ca  Time 4hr Heparin 3000. Access RUA AVF     Calcitriol  0.75 mcg po /HD  MIrcera 30 mcg q 2wks hd ( last given 04/23/19 )  Venofer  50 mg q wkly     Assessment/Plan  1. Acute Hypoxemic Resp Failure 2/2 COVID- Management per admit - decadron and remdesivir 2. ESRD -HD  Normal schedule TTS -   Had HD on Wed 11/18 as OP on PUI shift. Now getting HD today- 11/21.   Not sure right now when next treatment will be - will get back on TTS later.    Dialysis nursing staff overwhelmed by COVID patients which require a one on one separate treatment.  needs a higher K bath - communicated to nursing  3. Hypertension/volume  - bp stable/low - attempting 2 liter UF today ,    on home med Lopressor 25mg  bid  4. Anemia of ESRD  - hgb 11.7 dropped now to 9.2 ?    No obvious bleeding noted.  Fu hgb trend /Mircera  given 11/18 as OP-  Continuing iron  5. Metabolic bone disease -  Po rocaltrol/ renvela binder / sensipar     Louis Meckel    Labs: Basic Metabolic Panel: Recent Labs  Lab 04/24/19 0019 04/24/19 0545 04/26/19 0728  NA 137 139 136  K 3.6 4.0 3.8  CL 93* 94* 95*  CO2 27 26 23   GLUCOSE 101* 127* 142*  BUN 25* 30* 78*  CREATININE 5.97* 6.40* 10.27*  CALCIUM 8.0* 8.1* 7.6*  PHOS  --   --  5.6*   Liver Function Tests: Recent Labs  Lab 04/24/19 0019 04/24/19 0545  04/26/19 0728  AST 53* 46*  --   ALT 32 30  --   ALKPHOS 90 85  --   BILITOT 0.9 1.0  --   PROT 6.0* 6.1*  --   ALBUMIN 3.2* 2.9* 2.8*   No results for input(s): LIPASE, AMYLASE in the last 168 hours. No results for input(s): AMMONIA in the last 168 hours. CBC: Recent Labs  Lab 04/24/19 0019 04/24/19 0908 04/26/19 0727  WBC 1.8* 3.1* 8.0  NEUTROABS 1.4*  --   --   HGB 15.8 11.7* 9.2*  HCT 49.4 38.2* 28.4*  MCV 98.2 100.8* 97.3  PLT 74* 162 198   Cardiac Enzymes: No results for input(s): CKTOTAL, CKMB, CKMBINDEX, TROPONINI in the last 168 hours. CBG: No results for input(s): GLUCAP in the last 168 hours.  Iron Studies:  Recent Labs    04/24/19 0019  FERRITIN 4,332*   Studies/Results: No results found. Medications: Infusions: . [START ON 04/29/2019] ferric gluconate (FERRLECIT/NULECIT) IV    . remdesivir 100 mg in NS 250 mL Stopped (04/25/19 1131)  Scheduled Medications: . allopurinol  200 mg Oral Daily  . aspirin EC  81 mg Oral Daily  . calcitRIOL  0.75 mcg Oral Q T,Th,Sa-HD  . cinacalcet  30 mg Oral Q1200  . dexamethasone  6 mg Oral Daily  . heparin  5,000 Units Subcutaneous Q8H  . metoprolol tartrate  12.5 mg Oral 2 times per day on Sun Tue Thu Sat  . multivitamin  1 tablet Oral QHS  . sevelamer carbonate  1,600 mg Oral TID WC  . sodium chloride flush  3 mL Intravenous Q12H  . tamsulosin  0.4 mg Oral BID    have reviewed scheduled and prn medications.  Physical Exam: Full PE not performed as patient in COVID isolation.  This is order to preserve PPE and to limit exposure to all providers  04/26/2019,8:44 AM  LOS: 2 days

## 2019-04-26 NOTE — Progress Notes (Addendum)
   Subjective: Continues to feel tremulous. Complains of sore throat this morning which he attributes to coughing.   Objective:  Vital signs in last 24 hours: Vitals:   04/25/19 1500 04/25/19 1757 04/25/19 1841 04/25/19 2316  BP: 130/79  (!) 121/57 (!) 93/54  Pulse: 85 86 (!) 129 91  Resp: 16 17  20   Temp:   97.7 F (36.5 C) 97.7 F (36.5 C)  TempSrc:   Oral Oral  SpO2: 97% 97% 97% 94%   General: resting in bed, on 4L [place]; tremulous  HEENT: periorbital edema Cardiac: irregular rate  Pulm: CTA in anterior fields Abd: BS+; abdomen soft, non-tender, non-distended   Assessment/Plan:  Principal Problem:   COVID-19 Active Problems:   End stage renal disease (HCC)   Sick sinus syndrome (HCC)   Acute respiratory failure with hypoxemia (HCC)  Acute respiratory failure with hypoxemia (HCC) secondary to COVID-19 -Continue remdisivir per pharmacy 5 day course  - Continue 6mg  dexamethasone- patient does not feel tremulousness is too bothersome at this time - supportive care with throat spray, guaifenesin-dextromethorphan syrup   End stage renal disease ([place]) -nephrology following, will get HD today    Sick sinus syndrome (HCC) -PPM  Abdominal bloating -continue simethacone  Family updates: attempted to reach daughter listed as primary contact in chart, but there was no answer   Dispo: Anticipated discharge pending clinical improvement   Delice Bison, DO 04/26/2019, 10:51 AM Pager: [phone]

## 2019-04-27 DIAGNOSIS — U071 COVID-19: Principal | ICD-10-CM

## 2019-04-27 DIAGNOSIS — J9601 Acute respiratory failure with hypoxia: Secondary | ICD-10-CM

## 2019-04-27 DIAGNOSIS — I120 Hypertensive chronic kidney disease with stage 5 chronic kidney disease or end stage renal disease: Secondary | ICD-10-CM

## 2019-04-27 DIAGNOSIS — Z992 Dependence on renal dialysis: Secondary | ICD-10-CM

## 2019-04-27 DIAGNOSIS — Z79899 Other long term (current) drug therapy: Secondary | ICD-10-CM

## 2019-04-27 DIAGNOSIS — I499 Cardiac arrhythmia, unspecified: Secondary | ICD-10-CM

## 2019-04-27 DIAGNOSIS — N186 End stage renal disease: Secondary | ICD-10-CM

## 2019-04-27 LAB — COMPREHENSIVE METABOLIC PANEL
ALT: 62 U/L — ABNORMAL HIGH (ref 0–44)
AST: 94 U/L — ABNORMAL HIGH (ref 15–41)
Albumin: 2.6 g/dL — ABNORMAL LOW (ref 3.5–5.0)
Alkaline Phosphatase: 91 U/L (ref 38–126)
Anion gap: 12 (ref 5–15)
BUN: 57 mg/dL — ABNORMAL HIGH (ref 8–23)
CO2: 28 mmol/L (ref 22–32)
Calcium: 7.8 mg/dL — ABNORMAL LOW (ref 8.9–10.3)
Chloride: 99 mmol/L (ref 98–111)
Creatinine, Ser: 7.37 mg/dL — ABNORMAL HIGH (ref 0.61–1.24)
GFR calc Af Amer: 7 mL/min — ABNORMAL LOW (ref >60)
GFR calc non Af Amer: 6 mL/min — ABNORMAL LOW (ref >60)
Glucose, Bld: 132 mg/dL — ABNORMAL HIGH (ref 70–99)
Potassium: 4.9 mmol/L (ref 3.5–5.1)
Sodium: 139 mmol/L (ref 135–145)
Total Bilirubin: 0.7 mg/dL (ref 0.3–1.2)
Total Protein: 5.5 g/dL — ABNORMAL LOW (ref 6.5–8.1)

## 2019-04-27 LAB — RENAL FUNCTION PANEL
Albumin: 2.6 g/dL — ABNORMAL LOW (ref 3.5–5.0)
Anion gap: 13 (ref 5–15)
BUN: 46 mg/dL — ABNORMAL HIGH (ref 8–23)
CO2: 29 mmol/L (ref 22–32)
Calcium: 7.9 mg/dL — ABNORMAL LOW (ref 8.9–10.3)
Chloride: 96 mmol/L — ABNORMAL LOW (ref 98–111)
Creatinine, Ser: 6.59 mg/dL — ABNORMAL HIGH (ref 0.61–1.24)
GFR calc Af Amer: 8 mL/min — ABNORMAL LOW (ref >60)
GFR calc non Af Amer: 7 mL/min — ABNORMAL LOW (ref >60)
Glucose, Bld: 126 mg/dL — ABNORMAL HIGH (ref 70–99)
Phosphorus: 4.1 mg/dL (ref 2.5–4.6)
Potassium: 4.6 mmol/L (ref 3.5–5.1)
Sodium: 138 mmol/L (ref 135–145)

## 2019-04-27 LAB — CBC
HCT: 28.8 % — ABNORMAL LOW (ref 39.0–52.0)
Hemoglobin: 9.2 g/dL — ABNORMAL LOW (ref 13.0–17.0)
MCH: 31.2 pg (ref 26.0–34.0)
MCHC: 31.9 g/dL (ref 30.0–36.0)
MCV: 97.6 fL (ref 80.0–100.0)
Platelets: 199 10*3/uL (ref 150–400)
RBC: 2.95 MIL/uL — ABNORMAL LOW (ref 4.22–5.81)
RDW: 14.1 % (ref 11.5–15.5)
WBC: 7.3 10*3/uL (ref 4.0–10.5)
nRBC: 0 % (ref 0.0–0.2)

## 2019-04-27 MED ORDER — METOPROLOL TARTRATE 12.5 MG HALF TABLET
12.5000 mg | ORAL_TABLET | Freq: Every day | ORAL | Status: DC
  Administered 2019-04-27 – 2019-04-30 (×4): 12.5 mg via ORAL
  Filled 2019-04-27 (×4): qty 1

## 2019-04-27 MED ORDER — CHLORHEXIDINE GLUCONATE CLOTH 2 % EX PADS
6.0000 | MEDICATED_PAD | Freq: Every day | CUTANEOUS | Status: DC
  Administered 2019-04-27 – 2019-05-01 (×5): 6 via TOPICAL

## 2019-04-27 NOTE — Progress Notes (Signed)
  Date: 04/27/2019  Patient name: Timothy Carney  Medical record number: [id number]  Date of birth: May 09, 1926   Subjective: Patient states that his tremulousness has resolved and he is feeling better.  Patient also states that her shortness of breath is improving but he does have episodes of coughing fits which transiently worsens the shortness of breath.  Objective: Vitals:   04/27/19 0402 04/27/19 0736  BP: (!) 120/56 (!) 91/55  Pulse: 77 81  Resp: (!) 22 18  Temp: 98.1 F (36.7 C) 98.8 F (37.1 C)  SpO2: 94% 97%   General: Awake, alert, oriented x3, NAD CVS: Irregularly irregular, normal heart sounds Lungs: CTA bilaterally Abdomen: Soft, nontender, nondistended, normoactive bowel sounds Extremities: No edema noted, nontender to palpation  Assessment/plan:  1.  Acute hypoxic respiratory failure secondary to COVID-19: -We will continue with remdesivir per pharmacy to complete a 5-day course -Continue with dexamethasone 6 mg for now -Patient still on 4 L nasal cannula but O2 sats are in the 90s.  Will attempt to wean this down today -No further work-up at this time  2.  ESRD on hemodialysis: -Nephro follow-up and recommendations appreciated.  Patient did get hemodialysis yesterday -We will plan for hemodialysis again on Monday -No further work-up at this time  3.  Hypertension: -Patient blood pressures are borderline today with SBP's in the 90s -Was on Lopressor 25 mg twice daily.  This was changed to 12.5 mg nightly by nephrology -We will continue to monitor blood pressure closely   Aldine Contes, MD 04/27/2019, 1:13 PM

## 2019-04-27 NOTE — Progress Notes (Signed)
Subjective:  Did get HD yest-  No data avail on how much UF   Objective Vital signs in last 24 hours: Vitals:   04/26/19 1638 04/26/19 2004 04/27/19 0402 04/27/19 0736  BP: (!) 105/57 (!) 121/57 (!) 120/56 (!) 91/55  Pulse:  73 77 81  Resp: 16 (!) 22 (!) 22 18  Temp:  97.8 F (36.6 C) 98.1 F (36.7 C) 98.8 F (37.1 C)  TempSrc:  Oral Oral Oral  SpO2: 97% 98% 94% 97%   Weight change:  No intake or output data in the 24 hours ending 04/27/19 [id number]  Dialysis Orders: Center: [facility] . EDW 76.5HD Bath 2k, 2.25ca  Time 4hr Heparin 3000. Access RUA AVF     Calcitriol  0.75 mcg po /HD  MIrcera 30 mcg q 2wks hd ( last given 04/23/19 )  Venofer  50 mg q wkly     Assessment/Plan  1. Acute Hypoxemic Resp Failure 2/2 COVID- Management per admit - decadron and remdesivir 2. ESRD -HD  Normal schedule TTS -   Had HD on Wed 11/18 as OP on PUI shift, then 11/21 here.   Will try to plan for HD Mon- tomorrow  on holiday TTS schedule this week (M/W/Sat)   Dialysis nursing staff overwhelmed by COVID patients which require a one on one separate treatment.    3. Hypertension/volume  - bp soft-    on home med Lopressor 25mg  bid - will change to q HS and give parameters- UF as able  4. Anemia of ESRD  - hgb 11.7 dropped now to 9.2 ?    No obvious bleeding noted.  Fu hgb trend /Mircera  given 11/18 as OP-  Continuing iron  5. Metabolic bone disease -  Po rocaltrol/ renvela binder / sensipar - cont for now     [place]: Basic Metabolic Panel: Recent Labs  Lab 04/24/19 0545 04/26/19 0728 04/27/19 0341  NA 139 136 138  K 4.0 3.8 4.6  CL 94* 95* 96*  CO2 26 23 29   GLUCOSE 127* 142* 126*  BUN 30* 78* 46*  CREATININE 6.40* 10.27* 6.59*  CALCIUM 8.1* 7.6* 7.9*  PHOS  --  5.6* 4.1   Liver Function Tests: Recent Labs  Lab 04/24/19 0019 04/24/19 0545 04/26/19 0728 04/27/19 0341  AST 53* 46*  --   --   ALT 32 30  --   --   ALKPHOS 90 85  --   --   BILITOT 0.9  1.0  --   --   PROT 6.0* 6.1*  --   --   ALBUMIN 3.2* 2.9* 2.8* 2.6*   No results for input(s): LIPASE, AMYLASE in the last 168 hours. No results for input(s): AMMONIA in the last 168 hours. CBC: Recent Labs  Lab 04/24/19 0019 04/24/19 0908 04/26/19 0727 04/27/19 0341  WBC 1.8* 3.1* 8.0 7.3  NEUTROABS 1.4*  --   --   --   HGB 15.8 11.7* 9.2* 9.2*  HCT 49.4 38.2* 28.4* 28.8*  MCV 98.2 100.8* 97.3 97.6  PLT 74* 162 198 199   Cardiac Enzymes: No results for input(s): CKTOTAL, CKMB, CKMBINDEX, TROPONINI in the last 168 hours. CBG: No results for input(s): GLUCAP in the last 168 hours.  Iron Studies:  No results for input(s): IRON, TIBC, TRANSFERRIN, FERRITIN in the last 72 hours. Studies/Results: No results found. Medications: Infusions: . [START ON 04/29/2019] ferric gluconate (FERRLECIT/NULECIT) IV    . remdesivir 100 mg in  NS 250 mL 100 mg (04/26/19 1150)    Scheduled Medications: . allopurinol  200 mg Oral Daily  . aspirin EC  81 mg Oral Daily  . calcitRIOL  0.75 mcg Oral Q T,Th,Sa-HD  . cinacalcet  30 mg Oral Q1200  . dexamethasone  6 mg Oral Daily  . heparin  5,000 Units Subcutaneous Q8H  . metoprolol tartrate  12.5 mg Oral 2 times per day on Sun Tue Thu Sat  . multivitamin  1 tablet Oral QHS  . sevelamer carbonate  1,600 mg Oral TID WC  . sodium chloride flush  3 mL Intravenous Q12H  . tamsulosin  0.4 mg Oral BID    have reviewed scheduled and prn medications.  Physical Exam: Full PE not performed as patient in COVID isolation.  This is order to preserve PPE and to limit exposure to all providers  04/27/2019,8:12 AM  LOS: 3 days

## 2019-04-28 LAB — COMPREHENSIVE METABOLIC PANEL
ALT: 88 U/L — ABNORMAL HIGH (ref 0–44)
AST: 107 U/L — ABNORMAL HIGH (ref 15–41)
Albumin: 2.6 g/dL — ABNORMAL LOW (ref 3.5–5.0)
Alkaline Phosphatase: 97 U/L (ref 38–126)
Anion gap: 14 (ref 5–15)
BUN: 77 mg/dL — ABNORMAL HIGH (ref 8–23)
CO2: 26 mmol/L (ref 22–32)
Calcium: 7.9 mg/dL — ABNORMAL LOW (ref 8.9–10.3)
Chloride: 99 mmol/L (ref 98–111)
Creatinine, Ser: 8.54 mg/dL — ABNORMAL HIGH (ref 0.61–1.24)
GFR calc Af Amer: 6 mL/min — ABNORMAL LOW (ref >60)
GFR calc non Af Amer: 5 mL/min — ABNORMAL LOW (ref >60)
Glucose, Bld: 116 mg/dL — ABNORMAL HIGH (ref 70–99)
Potassium: 4.9 mmol/L (ref 3.5–5.1)
Sodium: 139 mmol/L (ref 135–145)
Total Bilirubin: 0.9 mg/dL (ref 0.3–1.2)
Total Protein: 5.7 g/dL — ABNORMAL LOW (ref 6.5–8.1)

## 2019-04-28 LAB — PHOSPHORUS: Phosphorus: 4.6 mg/dL (ref 2.5–4.6)

## 2019-04-28 LAB — CBC
HCT: 29.4 % — ABNORMAL LOW (ref 39.0–52.0)
Hemoglobin: 9.3 g/dL — ABNORMAL LOW (ref 13.0–17.0)
MCH: 30.9 pg (ref 26.0–34.0)
MCHC: 31.6 g/dL (ref 30.0–36.0)
MCV: 97.7 fL (ref 80.0–100.0)
Platelets: 240 10*3/uL (ref 150–400)
RBC: 3.01 MIL/uL — ABNORMAL LOW (ref 4.22–5.81)
RDW: 13.9 % (ref 11.5–15.5)
WBC: 8.7 10*3/uL (ref 4.0–10.5)
nRBC: 0 % (ref 0.0–0.2)

## 2019-04-28 MED ORDER — ALBUMIN HUMAN 25 % IV SOLN
INTRAVENOUS | Status: AC
  Filled 2019-04-28: qty 150

## 2019-04-28 MED ORDER — HEPARIN SODIUM (PORCINE) 1000 UNIT/ML DIALYSIS: 20.0000 [IU]/kg | INTRAMUSCULAR | Status: DC | PRN

## 2019-04-28 MED ORDER — CALCIUM CARBONATE ANTACID 500 MG PO CHEW
2.0000 | CHEWABLE_TABLET | Freq: Two times a day (BID) | ORAL | Status: DC | PRN
  Administered 2019-04-28: 400 mg via ORAL
  Filled 2019-04-28: qty 2

## 2019-04-28 NOTE — Progress Notes (Signed)
Subjective:  No problems, seen in room, nasal O2, slight cough, no sob  Objective Vital signs in last 24 hours: Vitals:   04/28/19 0954 04/28/19 1005 04/28/19 1015 04/28/19 1145  BP: (!) 112/43 121/60 (!) 123/56 122/82  Pulse: 86 60 81 63  Resp: 18 (!) 22 20   Temp:   97.9 F (36.6 C) 97.9 F (36.6 C)  TempSrc:   Oral Oral  SpO2:   95% 90%  Weight:   78.4 kg    Weight change:   Intake/Output Summary (Last 24 hours) at 04/28/2019 1434 Last data filed at 04/28/2019 1005 Gross per 24 hour  Intake -  Output 1689 ml  Net -1689 ml   Exam  alert , eldelry WM looks younger than stated age   No jvd   Chest bilat rhonchi, mild, no wheezing   Cor reg     ABd firm, nontender, no mass or ascites    Ext no LE edema    RUA AVF+bruit     NF< Ox3  Dialysis: [facility]  4h  2/2.25 bath  76.5kg   RUA AVF  Hep 3000. Calcitriol  0.75 mcg po /HD  MIrcera 30 mcg q 2wks hd ( last given 04/23/19 )  Venofer  50 mg q wkly     Assessment/Plan: 1. Acute Hypoxemic Resp Failure 2/2 COVID- Management per admit - decadron and remdesivir 2. ESRD -HD  Normal schedule TTS -   Had HD on Wed 11/18 as OP on PUI shift, then 11/21 here.  Plan HD today on holiday schedule.     3. Hypertension/volume  - bp soft-    on home med Lopressor 25mg  bid - will change to q HS and give parameters- UF as able  4. Anemia of ESRD  - hgb 11.7 dropped now to 9.2 ?    No obvious bleeding noted.  Fu hgb trend /Mircera  given 11/18 as OP-  Continuing iron  5. Metabolic bone disease -  Po rocaltrol/ renvela binder / sensipar - cont for now     Kelly Splinter, MD 04/28/2019, 4:39 PM        Labs: Basic Metabolic Panel: Recent Labs  Lab 04/26/19 0728 04/27/19 0341 04/27/19 1339 04/28/19 0541  NA 136 138 139 139  K 3.8 4.6 4.9 4.9  CL 95* 96* 99 99  CO2 23 29 28 26   GLUCOSE 142* 126* 132* 116*  BUN 78* 46* 57* 77*  CREATININE 10.27* 6.59* 7.37* 8.54*  CALCIUM 7.6* 7.9* 7.8* 7.9*  PHOS 5.6* 4.1  --  4.6    Liver Function Tests: Recent Labs  Lab 04/24/19 0545  04/27/19 0341 04/27/19 1339 04/28/19 0541  AST 46*  --   --  94* 107*  ALT 30  --   --  62* 88*  ALKPHOS 85  --   --  91 97  BILITOT 1.0  --   --  0.7 0.9  PROT 6.1*  --   --  5.5* 5.7*  ALBUMIN 2.9*   < > 2.6* 2.6* 2.6*   < > = values in this interval not displayed.   No results for input(s): LIPASE, AMYLASE in the last 168 hours. No results for input(s): AMMONIA in the last 168 hours. CBC: Recent Labs  Lab 04/24/19 0019 04/24/19 0908 04/26/19 0727 04/27/19 0341 04/28/19 0755  WBC 1.8* 3.1* 8.0 7.3 8.7  NEUTROABS 1.4*  --   --   --   --   HGB 15.8 11.7* 9.2*  9.2* 9.3*  HCT 49.4 38.2* 28.4* 28.8* 29.4*  MCV 98.2 100.8* 97.3 97.6 97.7  PLT 74* 162 198 199 240   Cardiac Enzymes: No results for input(s): CKTOTAL, CKMB, CKMBINDEX, TROPONINI in the last 168 hours. CBG: No results for input(s): GLUCAP in the last 168 hours.  Iron Studies:  No results for input(s): IRON, TIBC, TRANSFERRIN, FERRITIN in the last 72 hours. Studies/Results: No results found. Medications: Infusions: . albumin human    . [START ON 04/29/2019] ferric gluconate (FERRLECIT/NULECIT) IV      Scheduled Medications: . allopurinol  200 mg Oral Daily  . aspirin EC  81 mg Oral Daily  . calcitRIOL  0.75 mcg Oral Q T,Th,Sa-HD  . Chlorhexidine Gluconate Cloth  6 each Topical Q0600  . cinacalcet  30 mg Oral Q1200  . dexamethasone  6 mg Oral Daily  . heparin  5,000 Units Subcutaneous Q8H  . metoprolol tartrate  12.5 mg Oral QHS  . multivitamin  1 tablet Oral QHS  . sevelamer carbonate  1,600 mg Oral TID WC  . sodium chloride flush  3 mL Intravenous Q12H  . tamsulosin  0.4 mg Oral BID    have reviewed scheduled and prn medications.

## 2019-04-28 NOTE — Progress Notes (Signed)
   Vital Signs MEWS/VS Documentation      04/28/2019 0918 04/28/2019 0948 04/28/2019 0954 04/28/2019 1145   MEWS Score:  1  1  2   0   MEWS Score Color:  Green  Green  Yellow  Green   Resp:  16  20  18   -   Pulse:  72  68  86  63   BP:  (!) 94/50  (!) 92/47  (!) 211/43  122/82   Temp:  -  -  -  97.9 F (36.6 C)   O2 Device:  -  -  -  Nasal Cannula   O2 Flow Rate (L/min):  -  -  -  3 L/min       BP rechecked and WDL.  MD not notify.  RN to monitor.     Lubertha South 04/28/2019,12:08 PM

## 2019-04-28 NOTE — Progress Notes (Signed)
  Date: 04/28/2019  Patient name: Timothy Carney  Medical record number: [id number]  Date of birth: 1925/08/23   Subjective: just finishing HD on my evaluation, had to stop 15 minutes early due to bowel movement. Having some mild LLQ tenderness.  Objective: Vitals:   04/28/19 0948 04/28/19 0954  BP: (!) 92/47 (!) 211/43  Pulse: 68 86  Resp: 20 18  Temp:    SpO2:     General: NAD on 3L via [place] CVS: Irregularly irregular, normal heart sounds Lungs: CTAB  Abdomen: Soft, mild LLQ tenderness Extremities: Trace pedal edema bilateral  Assessment/plan:  1.  Acute hypoxic respiratory failure secondary to COVID-19: -We will continue with remdesivir per pharmacy to complete a 5-day course -Continue with dexamethasone 6 mg for now -Wean oxygen as tolerated. -repeat COVID labs from admission tomorrow CBCwdiff/CMP/LDH/D dimer/Fibrinogen/ CRP  2.  ESRD on hemodialysis: -HD today -No further work-up at this time  3.  Hypertension: -Patient blood pressures are borderline today with SBP's in the 90s -Metoprolol 12.5 mg nightly   4. Elevated AST/ALT - in setting of remdesivir, not yet to threshold for stopping early, last dose of remdesivir is today.  Called and Updated daughter [name] today.  Lucious Groves, DO 04/28/2019, 10:09 AM

## 2019-04-29 LAB — CBC WITH DIFFERENTIAL/PLATELET
Abs Immature Granulocytes: 0.43 10*3/uL — ABNORMAL HIGH (ref 0.00–0.07)
Basophils Absolute: 0 10*3/uL (ref 0.0–0.1)
Basophils Relative: 0 %
Eosinophils Absolute: 0 10*3/uL (ref 0.0–0.5)
Eosinophils Relative: 0 %
HCT: 29.2 % — ABNORMAL LOW (ref 39.0–52.0)
Hemoglobin: 9.5 g/dL — ABNORMAL LOW (ref 13.0–17.0)
Immature Granulocytes: 4 %
Lymphocytes Relative: 9 %
Lymphs Abs: 0.8 10*3/uL (ref 0.7–4.0)
MCH: 31.8 pg (ref 26.0–34.0)
MCHC: 32.5 g/dL (ref 30.0–36.0)
MCV: 97.7 fL (ref 80.0–100.0)
Monocytes Absolute: 0.6 10*3/uL (ref 0.1–1.0)
Monocytes Relative: 6 %
Neutro Abs: 8 10*3/uL — ABNORMAL HIGH (ref 1.7–7.7)
Neutrophils Relative %: 81 %
Platelets: 237 10*3/uL (ref 150–400)
RBC: 2.99 MIL/uL — ABNORMAL LOW (ref 4.22–5.81)
RDW: 14 % (ref 11.5–15.5)
WBC: 9.8 10*3/uL (ref 4.0–10.5)
nRBC: 0.2 % (ref 0.0–0.2)

## 2019-04-29 LAB — COMPREHENSIVE METABOLIC PANEL
ALT: 151 U/L — ABNORMAL HIGH (ref 0–44)
AST: 142 U/L — ABNORMAL HIGH (ref 15–41)
Albumin: 2.7 g/dL — ABNORMAL LOW (ref 3.5–5.0)
Alkaline Phosphatase: 103 U/L (ref 38–126)
Anion gap: 14 (ref 5–15)
BUN: 49 mg/dL — ABNORMAL HIGH (ref 8–23)
CO2: 28 mmol/L (ref 22–32)
Calcium: 8 mg/dL — ABNORMAL LOW (ref 8.9–10.3)
Chloride: 95 mmol/L — ABNORMAL LOW (ref 98–111)
Creatinine, Ser: 6.09 mg/dL — ABNORMAL HIGH (ref 0.61–1.24)
GFR calc Af Amer: 9 mL/min — ABNORMAL LOW (ref >60)
GFR calc non Af Amer: 7 mL/min — ABNORMAL LOW (ref >60)
Glucose, Bld: 89 mg/dL (ref 70–99)
Potassium: 4.3 mmol/L (ref 3.5–5.1)
Sodium: 137 mmol/L (ref 135–145)
Total Bilirubin: 1 mg/dL (ref 0.3–1.2)
Total Protein: 5.6 g/dL — ABNORMAL LOW (ref 6.5–8.1)

## 2019-04-29 LAB — CULTURE, BLOOD (ROUTINE X 2)
Culture: NO GROWTH
Culture: NO GROWTH
Special Requests: ADEQUATE

## 2019-04-29 LAB — D-DIMER, QUANTITATIVE: D-Dimer, Quant: 1 ug/mL-FEU — ABNORMAL HIGH (ref 0.00–0.50)

## 2019-04-29 LAB — PHOSPHORUS: Phosphorus: 4.4 mg/dL (ref 2.5–4.6)

## 2019-04-29 LAB — C-REACTIVE PROTEIN: CRP: 5 mg/dL — ABNORMAL HIGH (ref <1.0)

## 2019-04-29 LAB — LACTATE DEHYDROGENASE: LDH: 335 U/L — ABNORMAL HIGH (ref 98–192)

## 2019-04-29 LAB — FIBRINOGEN: Fibrinogen: 425 mg/dL (ref 210–475)

## 2019-04-29 MED ORDER — CHLORHEXIDINE GLUCONATE CLOTH 2 % EX PADS
6.0000 | MEDICATED_PAD | Freq: Every day | CUTANEOUS | Status: DC
  Administered 2019-05-01: 6 via TOPICAL

## 2019-04-29 NOTE — Progress Notes (Signed)
  Date: 04/29/2019  Patient name: Timothy Carney  Medical record number: [id number]  Date of birth: [birth year]   Subjective: buttocks is hurting, requests tylenol. "I want to get outta here doc"  Objective: Vitals:   04/28/19 2110 04/29/19 0857  BP: 125/76 (!) 100/46  Pulse: 70 (!) 56  Resp: 20 19  Temp: 97.8 F (36.6 C) 98.1 F (36.7 C)  SpO2: 98% 96%   General: NAD on 2L however only one nasal prong in place CVS: Irregularly irregular, normal heart sounds Lungs: CTAB  Abdomen: Soft, nontender Extremities: Trace pedal edema bilateral  Assessment/plan:  1.  Acute hypoxic respiratory failure secondary to COVID-19: -We will continue with remdesivir per pharmacy to complete a 5-day course -Continue with dexamethasone 6 mg may discontinue if no longer has supplemental O2 need -Wean oxygen as tolerated>> discussed with nursing potentially close to discharge -repeat COVID labs all downtrended from admission -PT evaluation and ambulatory oxygen check.  Anticipate discharge tomorrow, may need HD prior to leaving?  2.  ESRD on hemodialysis: -No further work-up at this time -Nephrology following, we are likely close to discharge possibly tomorrow as still weaning O2 off.  3.  Hypertension: -Patient blood pressures are borderline today with SBP's in the 90s -Metoprolol 12.5 mg nightly   4. Elevated AST/ALT - in setting of remdesivir, has now completed course, will trend.  Called and Updated daughter [name] today, discussed isolation protocol that he would need to self isolate for 20 days after onset of symptoms which I believe to be 11/17 (admitted 11/18) and possible discharge tomorrow.  Lucious Groves, DO 04/29/2019, 12:37 PM

## 2019-04-29 NOTE — Progress Notes (Signed)
Subjective:  Data taken from the chart.  Patient not examined today directly given COVID-19 + status, utilizing exam of the primary team and observations of RN's.    Objective Vital signs in last 24 hours: Vitals:   04/28/19 1520 04/28/19 2055 04/28/19 2110 04/29/19 0857  BP: 123/64 131/63 125/76 (!) 100/46  Pulse: 80 76 70 (!) 56  Resp: 18 20 20 19   Temp: 98.3 F (36.8 C)  97.8 F (36.6 C) 98.1 F (36.7 C)  TempSrc: Oral  Oral Oral  SpO2: 98% 95% 98% 96%  Weight:       Weight change: -1.2 kg  Intake/Output Summary (Last 24 hours) at 04/29/2019 1221 Last data filed at 04/29/2019 [id number] Gross per 24 hour  Intake 120 ml  Output -  Net 120 ml   Exam  Patient not examined today directly given COVID-19 + status, utilizing exam of the primary team and observations of RN's.    Dialysis: [facility]  4h  2/2.25 bath  76.5kg   RUA AVF  Hep 3000 Calcitriol  0.75 mcg po /HD  MIrcera 30 mcg q 2wks hd ( last given 04/23/19 )  Venofer  50 mg q wkly     Assessment/Plan: 1. Acute Hypoxemic Resp Failure 2/2 COVID- Management per admit - decadron and remdesivir, on 2L [place] 2. ESRD -HD TTS.  Plan HD tomorrow on holiday schedule.  3. Hypertension/volume  - lowered Lopressor 25mg  bid to 12.5 qhs, UF as bp's tolerate, is up 2kg by wts today 4. Anemia of ESRD  - hgb 11.7 dropped now to mid 9's.   No obvious bleeding noted.  OP Mircera  given 11/18 as OP-  Continuing iron  5. Metabolic bone disease -  Po rocaltrol/ renvela binder / sensipar - cont for now     Kelly Splinter, MD 04/29/2019, 12:21 PM        Labs: Basic Metabolic Panel: Recent Labs  Lab 04/27/19 0341 04/27/19 1339 04/28/19 0541 04/29/19 0510  NA 138 139 139 137  K 4.6 4.9 4.9 4.3  CL 96* 99 99 95*  CO2 29 28 26 28   GLUCOSE 126* 132* 116* 89  BUN 46* 57* 77* 49*  CREATININE 6.59* 7.37* 8.54* 6.09*  CALCIUM 7.9* 7.8* 7.9* 8.0*  PHOS 4.1  --  4.6 4.4   Liver Function Tests: Recent Labs  Lab 04/27/19 1339  04/28/19 0541 04/29/19 0510  AST 94* 107* 142*  ALT 62* 88* 151*  ALKPHOS 91 97 103  BILITOT 0.7 0.9 1.0  PROT 5.5* 5.7* 5.6*  ALBUMIN 2.6* 2.6* 2.7*   No results for input(s): LIPASE, AMYLASE in the last 168 hours. No results for input(s): AMMONIA in the last 168 hours. CBC: Recent Labs  Lab 04/24/19 0019 04/24/19 0908 04/26/19 0727 04/27/19 0341 04/28/19 0755 04/29/19 0510  WBC 1.8* 3.1* 8.0 7.3 8.7 9.8  NEUTROABS 1.4*  --   --   --   --  8.0*  HGB 15.8 11.7* 9.2* 9.2* 9.3* 9.5*  HCT 49.4 38.2* 28.4* 28.8* 29.4* 29.2*  MCV 98.2 100.8* 97.3 97.6 97.7 97.7  PLT 74* 162 198 199 240 237   Cardiac Enzymes: No results for input(s): CKTOTAL, CKMB, CKMBINDEX, TROPONINI in the last 168 hours. CBG: No results for input(s): GLUCAP in the last 168 hours.  Iron Studies:  No results for input(s): IRON, TIBC, TRANSFERRIN, FERRITIN in the last 72 hours. Studies/Results: No results found. Medications: Infusions: . ferric gluconate (FERRLECIT/NULECIT) IV      Scheduled  Medications: . allopurinol  200 mg Oral Daily  . aspirin EC  81 mg Oral Daily  . calcitRIOL  0.75 mcg Oral Q T,Th,Sa-HD  . Chlorhexidine Gluconate Cloth  6 each Topical Q0600  . cinacalcet  30 mg Oral Q1200  . dexamethasone  6 mg Oral Daily  . heparin  5,000 Units Subcutaneous Q8H  . metoprolol tartrate  12.5 mg Oral QHS  . multivitamin  1 tablet Oral QHS  . sevelamer carbonate  1,600 mg Oral TID WC  . sodium chloride flush  3 mL Intravenous Q12H  . tamsulosin  0.4 mg Oral BID    have reviewed scheduled and prn medications.

## 2019-04-29 NOTE — Evaluation (Addendum)
Occupational Therapy Evaluation Patient Details Name: Timothy Carney MRN: [id number] DOB: [birth year] Today's Date: 04/29/2019    History of Present Illness 85 year old male with past medical history significant for ESRD, hypertension, hyperlipidemia, sick sinus syndrome with pacemaker who presented to the emergency department for evaluation of shortness of breath is associated with fever cough and body aches. Known COVID + C x-ray bilateral opacities consistent with multifocal PNA. Admitted 04/24/19 for treatment of hypoxemic respiratory failure secondary to COVID-19   Clinical Impression   PTA, pt was living with his daughter and was independent with BADLs and did not use DME for mobility; daughter performing IADLs and pt with transportation to/from HD. Pt currently requiring Min Guard-Min A for LB ADLs and functional mobility with RW due to decreased balance, strength, and activity tolerance. VSS throughout and SpO2 >90% on RA with O2 monitor on earlobe. Pt would benefit from further acute OT to facilitate safe dc. Recommend dc to home with HHOT for further OT to optimize safety, independence with ADLs, and return to PLOF.      Follow Up Recommendations  Home health OT;Supervision/Assistance - 24 hour    Equipment Recommendations  None recommended by OT    Recommendations for Other Services PT consult     Precautions / Restrictions Precautions Precautions: Fall      Mobility Bed Mobility Overal bed mobility: Needs Assistance Bed Mobility: Supine to Sit     Supine to sit: Supervision;HOB elevated     General bed mobility comments: Supervision for safety with increased effort and time  Transfers Overall transfer level: Needs assistance Equipment used: None Transfers: Sit to/from Stand Sit to Stand: Min guard;Min assist         General transfer comment: Min Guard A for safety in power up to standing. Min A for correction of posterior lean and gaining of balance     Balance Overall balance assessment: Needs assistance Sitting-balance support: No upper extremity supported;Feet supported Sitting balance-Leahy Scale: Good     Standing balance support: Bilateral upper extremity supported;No upper extremity supported;During functional activity Standing balance-Leahy Scale: Poor Standing balance comment: Reliant on physical A or UE support. Posterior lean in standing                           ADL either performed or assessed with clinical judgement   ADL Overall ADL's : Needs assistance/impaired Eating/Feeding: Independent;Sitting   Grooming: Min guard;Standing   Upper Body Bathing: Set up;Supervision/ safety;Sitting   Lower Body Bathing: Min guard;Sit to/from stand;Minimal assistance   Upper Body Dressing : Set up;Supervision/safety;Sitting   Lower Body Dressing: Min guard;Sit to/from stand;Minimal assistance Lower Body Dressing Details (indicate cue type and reason): Pt able to adjust socks while sitting at EOB. Min A for dynamic standing balance Toilet Transfer: Min guard;Ambulation;RW(Simulated to recliner) [employer] forces technical officer Details (indicate cue type and reason): Min Guard A for safety in sit<>Stand and requiring Min A once in standing for correction of posterior lean as well as safe descent to chair         Functional mobility during ADLs: Min guard;Rolling walker General ADL Comments: Pt presenting with decreased strength, balance, and activity tolerance compared to his normal. Pt reporting that he feels tired and weak.  SpO2 >90% on RA throughout     Vision Baseline Vision/History: Wears glasses Wears Glasses: At all times Patient Visual Report: No change from baseline       Perception     Praxis  Pertinent Vitals/Pain Pain Assessment: Faces Faces Pain Scale: Hurts little more Pain Location: Generalized soreness Pain Descriptors / Indicators: Discomfort;Grimacing Pain Intervention(s): Monitored during  session;Repositioned     Hand Dominance     Extremity/Trunk Assessment Upper Extremity Assessment Upper Extremity Assessment: Generalized weakness   Lower Extremity Assessment Lower Extremity Assessment: Defer to PT evaluation   Cervical / Trunk Assessment Cervical / Trunk Assessment: Kyphotic   Communication Communication Communication: HOH   Cognition Arousal/Alertness: Awake/alert Behavior During Therapy: WFL for tasks assessed/performed Overall Cognitive Status: Within Functional Limits for tasks assessed                                 General Comments: Requiring cues to be repeated as he is HOH and environment very loud.   General Comments  SpO2 >90% on RA throughout activity    Exercises     Shoulder Instructions      Home Living Family/patient expects to be discharged to:: Private residence Living Arrangements: Children(Daughter) Available Help at Discharge: Family;Available 24 hours/day Type of Home: House Home Access: Stairs to enter [employer] of Steps: 1   Home Layout: One level     Bathroom Shower/Tub: Walk-in shower;Tub/shower unit(Uses shower most often)   Bathroom Toilet: Standard     Home Equipment: Shower seat;Tub bench;Walker - 2 wheels;Walker - 4 wheels;Bedside commode          Prior Functioning/Environment Level of Independence: Independent        Comments: Performs BADLs. Does not use DME for mobility.         OT Problem List: Decreased strength;Decreased range of motion;Decreased activity tolerance;Impaired balance (sitting and/or standing);Decreased knowledge of use of DME or AE;Decreased knowledge of precautions;Pain;Cardiopulmonary status limiting activity      OT Treatment/Interventions: Self-care/ADL training;Therapeutic exercise;Energy conservation;DME and/or AE instruction;Therapeutic activities;Patient/family education    OT Goals(Current goals can be found in the care plan section) Acute  Rehab OT Goals Patient Stated Goal: Go home soon OT Goal Formulation: With patient Time For Goal Achievement: 05/13/19 Potential to Achieve Goals: Good  OT Frequency: Min 2X/week   Barriers to D/C:            Co-evaluation PT/OT/SLP Co-Evaluation/Treatment: Yes Reason for Co-Treatment: To address functional/ADL transfers;For patient/therapist safety   OT goals addressed during session: ADL's and self-care      AM-PAC OT "6 Clicks" Daily Activity     Outcome Measure Help from another person eating meals?: None Help from another person taking care of personal grooming?: A Little Help from another person toileting, which includes using toliet, bedpan, or urinal?: A Little Help from another person bathing (including washing, rinsing, drying)?: A Little Help from another person to put on and taking off regular upper body clothing?: None Help from another person to put on and taking off regular lower body clothing?: A Little 6 Click Score: 20   End of Session Equipment Utilized During Treatment: Gait belt;Rolling walker Nurse Communication: Mobility status  Activity Tolerance: Patient tolerated treatment well;Patient limited by fatigue Patient left: in chair;with call bell/phone within reach;with chair alarm set  OT Visit Diagnosis: Unsteadiness on feet (R26.81);Other abnormalities of gait and mobility (R26.89);Muscle weakness (generalized) (M62.81);Pain Pain - part of body: (Generalized)                Time: [id number] OT Time Calculation (min): 31 min Charges:  OT General Charges $OT Visit: 1 Visit OT Evaluation $OT Eval Moderate  Complexity: [place], OTR/L Acute Rehab Pager: [phone] Office: [place] 04/29/2019, 3:21 PM

## 2019-04-29 NOTE — Evaluation (Signed)
Physical Therapy Evaluation Patient Details Name: Timothy Carney MRN: [id number] DOB: Feb 19, 1926 Today's Date: 04/29/2019   History of Present Illness  85 year old male with past medical history significant for ESRD, hypertension, hyperlipidemia, sick sinus syndrome with pacemaker who presented to the emergency department for evaluation of shortness of breath is associated with fever cough and body aches. Known COVID + C x-ray bilateral opacities consistent with multifocal PNA. Admitted 04/24/19 for treatment of hypoxemic respiratory failure secondary to COVID-19  Clinical Impression  PTA daughter living with pt, in single story home with small step to enter. Pt reports independence with ambulation and iADLs. Pt reports service drives him to and from HD. Pt is currently limited in safe mobility by decreased strength, balance and endurance. Pt requires supervision for bed mobility, min A for transfers and min A for steadying for ambulation with RW. Pt aware of his decreased balance and agreeable to use his RW once he gets home. Pt was able to ambulated 45 feet on RA while maintaining SaO2 >90%O2. PT recommending HHPT level rehab at discharge to improve strength and balance. PT will continue to follow acutely.     Follow Up Recommendations Home health PT;Supervision/Assistance - 24 hour    Equipment Recommendations  None recommended by PT       Precautions / Restrictions Precautions Precautions: Fall Restrictions Weight Bearing Restrictions: No      Mobility  Bed Mobility Overal bed mobility: Needs Assistance Bed Mobility: Supine to Sit     Supine to sit: Supervision;HOB elevated     General bed mobility comments: Supervision for safety with increased effort and time  Transfers Overall transfer level: Needs assistance Equipment used: None Transfers: Sit to/from Stand Sit to Stand: Min guard;Min assist         General transfer comment: Min Guard A for safety in power up to  standing. Min A for correction of posterior lean and gaining of balance  Ambulation/Gait Ambulation/Gait assistance: Min assist Gait Distance (Feet): 45 Feet Assistive device: Rolling walker (2 wheeled) Gait Pattern/deviations: Decreased step length - right;Decreased step length - left;Step-through pattern;Trunk flexed Gait velocity: slowed Gait velocity interpretation: <1.31 ft/sec, indicative of household ambulator General Gait Details: pt reports not using AD at home, however agreeable to using RW in room, minA for ambulation with RW, vc for proximity to RW and upright posture       Balance Overall balance assessment: Needs assistance Sitting-balance support: No upper extremity supported;Feet supported Sitting balance-Leahy Scale: Good     Standing balance support: Bilateral upper extremity supported;No upper extremity supported;During functional activity Standing balance-Leahy Scale: Poor Standing balance comment: Reliant on physical A or UE support. Posterior lean in standing                             Pertinent Vitals/Pain Pain Assessment: Faces Faces Pain Scale: Hurts little more Pain Location: Generalized soreness Pain Descriptors / Indicators: Discomfort;Grimacing Pain Intervention(s): Monitored during session;Limited activity within patient's tolerance;Repositioned    Home Living Family/patient expects to be discharged to:: Private residence Living Arrangements: Children(Daughter) Available Help at Discharge: Family;Available 24 hours/day Type of Home: House Home Access: Stairs to enter   [employer] of Steps: 1 Home Layout: One level Home Equipment: Shower seat;Tub bench;Walker - 2 wheels;Walker - 4 wheels;Bedside commode      Prior Function Level of Independence: Independent         Comments: Performs BADLs. Does not use DME for mobility.  Extremity/Trunk Assessment   Upper Extremity Assessment Upper Extremity  Assessment: Defer to OT evaluation    Lower Extremity Assessment Lower Extremity Assessment: Generalized weakness    Cervical / Trunk Assessment Cervical / Trunk Assessment: Kyphotic  Communication   Communication: HOH  Cognition Arousal/Alertness: Awake/alert Behavior During Therapy: WFL for tasks assessed/performed Overall Cognitive Status: Within Functional Limits for tasks assessed                                 General Comments: Requiring cues to be repeated as he is [facility] and environment very loud.      General Comments General comments (skin integrity, edema, etc.): SpO2 >90% on RA throughout activity        Assessment/Plan    PT Assessment Patient needs continued PT services  PT Problem List Decreased strength;Decreased activity tolerance;Decreased balance;Decreased mobility;Decreased safety awareness       PT Treatment Interventions DME instruction;Gait training;Functional mobility training;Therapeutic activities;Therapeutic exercise;Balance training;Cognitive remediation;Patient/family education    PT Goals (Current goals can be found in the Care Plan section)  Acute Rehab PT Goals Patient Stated Goal: Go home soon PT Goal Formulation: With patient Time For Goal Achievement: 05/13/19 Potential to Achieve Goals: Good    Frequency Min 3X/week        Co-evaluation PT/OT/SLP Co-Evaluation/Treatment: Yes Reason for Co-Treatment: To address functional/ADL transfers PT goals addressed during session: Mobility/safety with mobility OT goals addressed during session: ADL's and self-care       AM-PAC PT "6 Clicks" Mobility  Outcome Measure Help needed turning from your back to your side while in a flat bed without using bedrails?: None Help needed moving from lying on your back to sitting on the side of a flat bed without using bedrails?: A Little Help needed moving to and from a bed to a chair (including a wheelchair)?: A Little Help needed  standing up from a chair using your arms (e.g., wheelchair or bedside chair)?: A Little Help needed to walk in hospital room?: A Little Help needed climbing 3-5 steps with a railing? : A Lot 6 Click Score: 18    End of Session Equipment Utilized During Treatment: Gait belt Activity Tolerance: Patient tolerated treatment well Patient left: in chair;with call bell/phone within reach;with chair alarm set Nurse Communication: Mobility status;Other (comment)(able to ambulate on RA ) PT Visit Diagnosis: Unsteadiness on feet (R26.81);Other abnormalities of gait and mobility (R26.89);Muscle weakness (generalized) (M62.81);History of falling (Z91.81);Difficulty in walking, not elsewhere classified (R26.2)    Time: [id number] PT Time Calculation (min) (ACUTE ONLY): 31 min   Charges:   PT Evaluation $PT Eval Moderate Complexity: 1 Mod          Brigitte Soderberg B. Migdalia Dk PT, DPT Acute Rehabilitation Services Pager [phone] Office [phone]   [place] 04/29/2019, 3:36 PM

## 2019-04-29 NOTE — TOC Initial Note (Signed)
Transition of Care [facility]) - Initial/Assessment Note    Patient Details  Name: Timothy Carney MRN: [id number] Date of Birth: Aug 07, 1925  [facility]) CM/SW Contact:    Timothy Labrador, RN Phone Number: 04/29/2019, 4:35 PM  Clinical Narrative:  PTA from home with daughter.  CM unable to reach pt via phone (CM working remote).  CM contacted pts daughter Timothy Carney that lives with the pt.  Daughter informed CM that pt is extremely HOH and that he defers medical decisions to daughter. Pt has PCP and daughter denied barrier with paying for medications.   Daughter is in agreement with [place] - medicare.gov HH list provided - daughter chose [name] - agency accepted pending orders.  CM requested Timothy Carney orders. Daughter informed CM that she will be with pt at discharge 24/7.    Pt denied needing equipment in the home - none recommended by therapy at this point                   Expected Discharge Plan: [place]     Patient Goals and CMS Choice     Choice offered to / list presented to : Adult Children  Expected Discharge Plan and Services Expected Discharge Plan: [place] Choice: [place] arrangements for the past 2 months: Single Family Home                           HH Arranged: PT, OT [place] Agency: [place] Date [facility] Agency Contacted: 04/29/19 Time [place]: [phone] Representative spoke with at [place]: Timothy Carney  Prior Living Arrangements/Services Living arrangements for the past 2 months: [place] with:: Adult Children Patient language and need for interpreter reviewed:: Yes        Need for Family Participation in Patient Care: Yes (Comment) Care giver support system in place?: Yes (comment)   Criminal Activity/Legal Involvement Pertinent to Current Situation/Hospitalization: No - Comment as needed  Activities of Daily Living      Permission Sought/Granted                 Emotional Assessment       Orientation: : Fluctuating Orientation (Suspected and/or reported [place])   Psych Involvement: No (comment)  Admission diagnosis:  Shortness of breath [R06.02] Suspected COVID-19 virus infection [Z20.828] Acute respiratory failure with hypoxia ([place]) [J96.01] Patient Active Problem List   Diagnosis Date Noted  . Acute respiratory failure with hypoxemia ([place]) 04/24/2019  . COVID-19 04/24/2019  . Nonsustained ventricular tachycardia ([place]) 04/21/2019  . Chronic pain syndrome 05/16/2016  . Pacemaker 02/13/2016  . Anxiety 01/06/2016  . Hypertensive kidney disease with ESRD (end-stage renal disease) ([place]) 01/06/2016  . Chronic kidney disease, stage 5, kidney failure ([place]) 01/06/2016  . Hyperlipidemia, mixed 01/06/2016  . PSVT (paroxysmal supraventricular tachycardia) ([place]) 01/06/2016  . Sick sinus syndrome ([place]) 01/06/2016  . Mechanical complication of other vascular device, implant, and graft 09/21/2011  . End stage renal disease ([place]) 07/13/2011   PCP:  Timothy Greenhouse, MD Pharmacy:   [id number] - [place], [place] [address] [place] [address] Phone: [phone] Fax: [phone]  [facility] - [place], [place] - [address] [employer], [address] [address] Phone: [phone] Fax: [phone]  Social Determinants of Health (SDOH) Interventions    Readmission Risk Interventions No flowsheet data found.

## 2019-04-30 LAB — COMPREHENSIVE METABOLIC PANEL
ALT: 148 U/L — ABNORMAL HIGH (ref 0–44)
AST: 96 U/L — ABNORMAL HIGH (ref 15–41)
Albumin: 2.7 g/dL — ABNORMAL LOW (ref 3.5–5.0)
Alkaline Phosphatase: 97 U/L (ref 38–126)
Anion gap: 15 (ref 5–15)
BUN: 73 mg/dL — ABNORMAL HIGH (ref 8–23)
CO2: 27 mmol/L (ref 22–32)
Calcium: 7.7 mg/dL — ABNORMAL LOW (ref 8.9–10.3)
Chloride: 94 mmol/L — ABNORMAL LOW (ref 98–111)
Creatinine, Ser: 7.84 mg/dL — ABNORMAL HIGH (ref 0.61–1.24)
GFR calc Af Amer: 6 mL/min — ABNORMAL LOW (ref >60)
GFR calc non Af Amer: 5 mL/min — ABNORMAL LOW (ref >60)
Glucose, Bld: 105 mg/dL — ABNORMAL HIGH (ref 70–99)
Potassium: 4.7 mmol/L (ref 3.5–5.1)
Sodium: 136 mmol/L (ref 135–145)
Total Bilirubin: 0.9 mg/dL (ref 0.3–1.2)
Total Protein: 5.4 g/dL — ABNORMAL LOW (ref 6.5–8.1)

## 2019-04-30 NOTE — Discharge Instructions (Signed)
Please remain in isolation for COVID-19 as I expect you may be contagious until December 6th, 2020.  Follow the instructions from your dialysis center about next treatments, call ahead if there is a question.   I would like you to see your PCP in about 2 weeks for follow up.

## 2019-04-30 NOTE — Care Management (Addendum)
Renal Coordinator working on outpt HD placement/transportation for COVID positive pt . COVID positive HD facilities are in [place] and daughter raised transportation hardship as pt lives in [place].  This will serve as a barrier for discharge until this can be solved - bedside nurse aware  CM spoke with [facility] supervisor regarding any other options of transportation - suggestion made of contacting [facility] requesting transport consideration.  Renal Coordinator informed CM that avenue has already been approached is not feasible.

## 2019-04-30 NOTE — Progress Notes (Signed)
Subjective:  Data taken from the chart.  Patient not examined today directly given COVID-19 + status, utilizing exam of the primary team and observations of RN's.   Per pmd notes pt now off of supp O2, able to ambulate w/ PT/ OT.     Objective Vital signs in last 24 hours: Vitals:   04/29/19 1633 04/29/19 2038 04/29/19 2320 04/30/19 0810  BP: 130/65 128/66 133/75 (!) 100/48  Pulse: 75 69 71 68  Resp: 18 17  17   Temp: 97.9 F (36.6 C)  97.7 F (36.5 C) (!) 97.5 F (36.4 C)  TempSrc: Oral  Oral Oral  SpO2: 91% 92% (!) 89% 91%  Weight:       Weight change:  No intake or output data in the 24 hours ending 04/30/19 1227 Exam  Patient not examined today directly given COVID-19 + status, utilizing exam of the primary team and observations of RN's.    Dialysis: [facility]  4h  2/2.25 bath  76.5kg   RUA AVF  Hep 3000 Calcitriol  0.75 mcg po /HD  MIrcera 30 mcg q 2wks hd ( last given 04/23/19 )  Venofer  50 mg q wkly     Assessment/Plan: 1. Acute Hypoxemic Resp Failure 2/2 COVID- Management per admit - decadron and remdesivir, on 2L [place] 2. ESRD -HD TTS.  Plan HD today on holiday schedule.  3. Hypertension/volume  - lowered Lopressor 25mg  bid to 12.5 qhs, UF as bp's tolerate, is up 2kg by wts today 4. Anemia of ESRD  - hgb 11.7 dropped now to mid 9's.   No obvious bleeding noted.  OP Mircera  given 11/18 as OP-  Continuing iron  5. Metabolic bone disease -  Po rocaltrol/ renvela binder / sensipar - cont for now     Kelly Splinter, MD 04/30/2019, 12:27 PM        Labs: Basic Metabolic Panel: Recent Labs  Lab 04/27/19 0341  04/28/19 0541 04/29/19 0510 04/30/19 0410  NA 138   < > 139 137 136  K 4.6   < > 4.9 4.3 4.7  CL 96*   < > 99 95* 94*  CO2 29   < > 26 28 27   GLUCOSE 126*   < > 116* 89 105*  BUN 46*   < > 77* 49* 73*  CREATININE 6.59*   < > 8.54* 6.09* 7.84*  CALCIUM 7.9*   < > 7.9* 8.0* 7.7*  PHOS 4.1  --  4.6 4.4  --    < > = values in this interval not  displayed.   Liver Function Tests: Recent Labs  Lab 04/28/19 0541 04/29/19 0510 04/30/19 0410  AST 107* 142* 96*  ALT 88* 151* 148*  ALKPHOS 97 103 97  BILITOT 0.9 1.0 0.9  PROT 5.7* 5.6* 5.4*  ALBUMIN 2.6* 2.7* 2.7*   No results for input(s): LIPASE, AMYLASE in the last 168 hours. No results for input(s): AMMONIA in the last 168 hours. CBC: Recent Labs  Lab 04/24/19 0019 04/24/19 0908 04/26/19 0727 04/27/19 0341 04/28/19 0755 04/29/19 0510  WBC 1.8* 3.1* 8.0 7.3 8.7 9.8  NEUTROABS 1.4*  --   --   --   --  8.0*  HGB 15.8 11.7* 9.2* 9.2* 9.3* 9.5*  HCT 49.4 38.2* 28.4* 28.8* 29.4* 29.2*  MCV 98.2 100.8* 97.3 97.6 97.7 97.7  PLT 74* 162 198 199 240 237   Cardiac Enzymes: No results for input(s): CKTOTAL, CKMB, CKMBINDEX, TROPONINI in the last 168 hours.  CBG: No results for input(s): GLUCAP in the last 168 hours.  Iron Studies:  No results for input(s): IRON, TIBC, TRANSFERRIN, FERRITIN in the last 72 hours. Studies/Results: No results found. Medications: Infusions: . ferric gluconate (FERRLECIT/NULECIT) IV Stopped (04/29/19 1930)    Scheduled Medications: . allopurinol  200 mg Oral Daily  . aspirin EC  81 mg Oral Daily  . calcitRIOL  0.75 mcg Oral Q T,Th,Sa-HD  . Chlorhexidine Gluconate Cloth  6 each Topical Q0600  . Chlorhexidine Gluconate Cloth  6 each Topical Q0600  . cinacalcet  30 mg Oral Q1200  . dexamethasone  6 mg Oral Daily  . heparin  5,000 Units Subcutaneous Q8H  . metoprolol tartrate  12.5 mg Oral QHS  . multivitamin  1 tablet Oral QHS  . sevelamer carbonate  1,600 mg Oral TID WC  . sodium chloride flush  3 mL Intravenous Q12H  . tamsulosin  0.4 mg Oral BID    have reviewed scheduled and prn medications.

## 2019-04-30 NOTE — Discharge Summary (Signed)
Name: Timothy Carney MRN: [id number] DOB: Oct 17, 1925 85 y.o. PCP: Timothy Greenhouse, MD  Date of Admission: 04/23/2019 10:12 PM Date of Discharge: 04/30/19 Attending Physician: Dr. Lynnae Carney  Discharge Diagnosis: 1. Acute Hypoxic Respiratory Failure due to COVID-19 2. ESRD on HD 3. Elevated AST/ALT due to Remdisivir therapy  Discharge Medications: Allergies as of 05/01/2019   No Known Allergies     Medication List    TAKE these medications   acetaminophen 500 MG tablet Commonly known as: TYLENOL Take 500 mg by mouth every 6 (six) hours as needed for headache (pain).   ALPRAZolam 1 MG tablet Commonly known as: XANAX Take 1 mg by mouth at bedtime. Notes to patient: 05/01/2019 PM   aspirin EC 81 MG tablet Take 81 mg by mouth daily. Notes to patient: 05/02/2019 AM   cinacalcet 30 MG tablet Commonly known as: SENSIPAR Take 30 mg by mouth at bedtime. Do not take less than 12 hours prior to dialysis   diphenhydrAMINE 25 MG tablet Commonly known as: BENADRYL Take 50 mg by mouth at bedtime as needed for sleep.   metoprolol tartrate 25 MG tablet Commonly known as: LOPRESSOR Take 1 tablet (25 mg total) by mouth 2 (two) times daily. What changed:   how much to take  when to take this  reasons to take this Notes to patient: 05/01/2019 PM   mometasone 0.1 % cream Commonly known as: ELOCON Apply 1 application topically daily as needed (itching).   multivitamin with minerals Tabs tablet Take 1 tablet by mouth daily.   nitroGLYCERIN 0.4 MG SL tablet Commonly known as: NITROSTAT Place 0.4 mg under the tongue every 5 (five) minutes as needed for chest pain.   ondansetron 4 MG tablet Commonly known as: ZOFRAN Take 1 tablet by mouth every 8 (eight) hours as needed for nausea.   Renvela 800 MG tablet Generic drug: sevelamer carbonate Take 1,600 mg by mouth 3 (three) times daily with meals. Notes to patient: 05/01/2019 PM with Supper   traMADol 50 MG tablet  Commonly known as: ULTRAM Take 50 mg by mouth every 6 (six) hours as needed (pain).   vitamin B-12 1000 MCG tablet Commonly known as: CYANOCOBALAMIN Take 1,000 mcg by mouth daily.   vitamin C 500 MG tablet Commonly known as: ASCORBIC ACID Take 500 mg by mouth daily.   vitamin E 400 UNIT capsule Take 400 Units by mouth daily. 180 mcg       Disposition and follow-up:   Mr.Timothy Carney was discharged from [facility] in Stable condition.  At the hospital follow up visit please address:  1.  Lingering symptoms of COVID?, Repeat LFTs  2.  Labs / imaging needed at time of follow-up: CMP  3.  Pending labs/ test needing follow-up: none  Follow-up Appointments: Follow-up Information    Carney, Timothy Graff, MD. Call in 2 week(s).   Specialty: Family Medicine Contact information: [place] [address] [phone]        Timothy Liter, MD .   Specialty: Cardiology Contact information: [place] [address] [id number]        Timothy Haw, MD .   Specialty: Cardiology Contact information: [place] [address] [phone]        Timothy Carney through [facility] Follow up on 05/03/2019.   Why: They will start on Saturday and pick you up between 10:30-10:45 am. They will transport you from home in [place] to [place] and back home after treatment.           Hospital Course by problem list: 1. Acute Hypoxic Respiratory failure due to COVID-19  Presented with worsening dyspnea and fever.  Symptoms onset a few days prior and was worsening.  Symptoms were consistent with COVID-19 as was his initial laboratory evaluation with elevations in D-dimer, CRP, fibrinogen, as well as lymphopenia.  He was treated with supplemental oxygen and placed in airborne isolation, COVID-19 PCR testing was collected on 11/19 and resulted positive.  He was treated with a 5 day course of remdisivir and 8 day course of  dexamethasone with improvement of labs as well as symptoms.    2. ESRD on HD  Nephrolgoy consulted and patient continued routine hemodialysis while inpatient.  3. Elevated AST/ALT  Initial values normal, with treatment with remdisivir labs elevated but not to our threshold for discontinuation of therapy (10x normal) he completed remdisivir therapy and AST/ALT was improving at time of discharge.  Discharge Vitals:   BP (!) 126/47 (BP Location: Left Arm)   Pulse 67   Temp 98.4 F (36.9 C) (Oral)   Resp 15   Wt 78 kg   SpO2 95%   BMI 25.39 kg/m   Pertinent Labs, Studies, and Procedures:  SARS COV2 04/24/19>> positive  Discharge Instructions: Discharge Instructions    MyChart COVID-19 home monitoring program   Complete by: May 01, 2019    Is the patient willing to use the [facility] for home monitoring?: Yes   Temperature monitoring   Complete by: May 01, 2019    After how many days would you like to receive a notification of this patient's flowsheet entries?: 1   Call MD for:  difficulty breathing, headache or visual disturbances   Complete by: As directed    Call MD for:  difficulty breathing, headache or visual disturbances   Complete by: As directed    Call MD for:  extreme fatigue   Complete by: As directed    Call MD for:  persistant dizziness or light-headedness   Complete by: As directed    Call MD for:  temperature >100.4   Complete by: As directed    Call MD for:  temperature >100.4   Complete by: As directed    Diet - low sodium heart healthy   Complete by: As directed    Diet - low sodium heart healthy   Complete by: As directed    Discharge instructions   Complete by: As directed    [name], it was a pleasure taking care of you. So glad you successfully recovered from COVID-19. As explained, please continue isolating protocol to complete a total of 20 days self-isolation, and you will wear a mask at dialysis.   Increase activity slowly   Complete  by: As directed    Increase activity slowly   Complete by: As directed       Signed: Jean Rosenthal, MD 05/07/2019, 9:19 AM

## 2019-04-30 NOTE — Progress Notes (Addendum)
Renal Navigator spoke with daughter/Amanda regarding patient's need to receive OP HD treatment at [facility] isolation clinic in [place]. She reports that she has no transportation for patient to go to [place] for OP HD because they do not have a car. She states they have no support and no one to ask to assist. Patient's home clinic is [facility] and patient utilizes [employer]. Renal Navigator asked if she has contacted them to see if they are transporting COVID positive patients and she said "I didn't have a reason to." She states she does not have the number with her because she is out running errands in [place]. Renal Navigator asked that she contact them when she gets home to inquire about their COVID policy and call Navigator later today. She agreed. Meanwhile, Navigator will attempt to research this. Renal Navigator contacted [employer]. Clifton James to inquire about patient's transportation and she is aware that [employer] will not transport COVID positive patients. She thinks she may have information on another transportation resource, [employer], which she called and learned that they are unable to assist. She has initiated a request through [facility] to request transportation assistance for COVID positive patients. Renal Navigator is concerned that this may not turn up with any available resources either. Renal Navigator is aware from past [facility] HD patients that [facility] is not an option to transport COVID positive patients from [facility] in this case) to and from [facility] 3x per week for OP HD treatment.   Patient lives in [place], [place] and Renal Navigator researched other COVID positive shifts that might be closer to patient, including [place] clinics, however, [facility] appears to be the closest clinic with a COVID shift.  Renal Navigator called Director of Operations/A. Jacqualin Combes over the [facility] to inform her of patient's  barrier to discharge from hospital and inability to treat at [facility] clinic due to lack of transportation given COVID positive status. She states she will look in to the situation and call Renal Navigator back.  Renal Navigator has updated Attending/Dr. Heber [place], Nephrologist/Dr. Jonnie Finner, and CM/S. [place]. Renal Navigator will continue to follow.  Alphonzo Cruise, [place] Renal Navigator [phone]

## 2019-04-30 NOTE — Progress Notes (Addendum)
  Date: 04/30/2019  Patient name: Timothy Carney  Medical record number: [id number]  Date of birth: 09-04-1925   Subjective: feeling much better today, has remained off supplemental O2, able to ambulate well with PT and OT  Objective: Vitals:   04/29/19 2320 04/30/19 0810  BP: 133/75 (!) 100/48  Pulse: 71 68  Resp:  17  Temp: 97.7 F (36.5 C) (!) 97.5 F (36.4 C)  SpO2: (!) 89% 91%   General: NAD on room air CVS: Irregularly irregular, normal heart sounds Lungs: mild bibasilar crackles  Abdomen: Soft, nontender Extremities: Trace pedal edema bilateral  Assessment/plan:  1.  Acute hypoxic respiratory failure secondary to COVID-19: -Completed 5 day course of  remdesivir -Continue dexamethasone total of 6 days unless discharged before. (no need to send home) - discussed 20 day self isolation, mask use at hemodialysis  2.  ESRD on hemodialysis: -No further work-up at this time -Will need Hemodialysis today and able to be discharged from my standpoint after  3.  Hypertension: -Metoprolol 12.5 mg nightly   4. Elevated AST/ALT - in setting of remdesivir, improved after discontinuation of remdesivir, repeat AST and ALT needed at hospital follow up  Dispo: stable for discharge however, his current outpatient hemodialysis center cannot take him yet, SW is working on options, may have to stay until this is sorted out.  Lucious Groves, DO 04/30/2019, 11:06 AM

## 2019-04-30 NOTE — Progress Notes (Signed)
Occupational Therapy Treatment Patient Details Name: Timothy Carney MRN: [id number] DOB: [birth year] Today's Date: 04/30/2019    History of present illness 85 year old male with past medical history significant for ESRD, hypertension, hyperlipidemia, sick sinus syndrome with pacemaker who presented to the emergency department for evaluation of shortness of breath is associated with fever cough and body aches. Known COVID + C x-ray bilateral opacities consistent with multifocal PNA. Admitted 04/24/19 for treatment of hypoxemic respiratory failure secondary to COVID-19   OT comments  Focused session on education for energy conservation and fall prevention. Providing handouts for EC and fall prevention and reviewed with pt. Pt discussing his normal daily routine and how he uses these techniques. Pt requiring increased cues this session for redirection back to topics and education. Continue to recommend dc to home with HHOT and will continue to follow acutely as admitted.    Follow Up Recommendations  Home health OT;Supervision/Assistance - 24 hour    Equipment Recommendations  None recommended by OT    Recommendations for Other Services PT consult    Precautions / Restrictions Precautions Precautions: Fall Restrictions Weight Bearing Restrictions: No       Mobility Bed Mobility Overal bed mobility: Needs Assistance Bed Mobility: Supine to Sit     Supine to sit: Supervision;HOB elevated     General bed mobility comments: Supervision for safety with increased effort and time  Transfers                      Balance Overall balance assessment: Needs assistance Sitting-balance support: No upper extremity supported;Feet supported Sitting balance-Leahy Scale: Good                                     ADL either performed or assessed with clinical judgement   ADL Overall ADL's : Needs assistance/impaired                                        General ADL Comments: Pt declining ADLs and mobility at this time as he performed toileting, grooming, and mobility with RN earlier. Focused session on handouts and education for energy conservation and fall prevention. Pt with tangental thoughts and following 50% of education requiring additional cues to attend.      Vision       Perception     Praxis      Cognition Arousal/Alertness: Awake/alert Behavior During Therapy: WFL for tasks assessed/performed Overall Cognitive Status: Within Functional Limits for tasks assessed                                 General Comments: Pt requiring increased cues this session for redirection to topics as thoughts were more tangental.         Exercises     Shoulder Instructions       General Comments      Pertinent Vitals/ Pain       Pain Assessment: Faces Faces Pain Scale: No hurt Pain Intervention(s): Monitored during session  Home Living  Prior Functioning/Environment              Frequency  Min 2X/week        Progress Toward Goals  OT Goals(current goals can now be found in the care plan section)  Progress towards OT goals: Progressing toward goals  Acute Rehab OT Goals Patient Stated Goal: Go home soon OT Goal Formulation: With patient Time For Goal Achievement: 05/13/19 Potential to Achieve Goals: Good ADL Goals Pt Will Perform Grooming: with modified independence;standing Pt Will Perform Lower Body Dressing: with modified independence;sit to/from stand Pt Will Transfer to Toilet: with modified independence;ambulating;bedside commode Pt Will Perform Toileting - Clothing Manipulation and hygiene: with modified independence;sitting/lateral leans;sit to/from stand Additional ADL Goal #1: Pt will verablize three energy coservation techniques for ADLs with 2-3 cues Additional ADL Goal #2: Pt will verablize three fall prevention  technqiues with 2-3 cues  Plan Discharge plan remains appropriate    Co-evaluation    PT/OT/SLP Co-Evaluation/Treatment: Yes Reason for Co-Treatment: For patient/therapist safety;To address functional/ADL transfers   OT goals addressed during session: ADL's and self-care      AM-PAC OT "6 Clicks" Daily Activity     Outcome Measure   Help from another person eating meals?: None Help from another person taking care of personal grooming?: A Little Help from another person toileting, which includes using toliet, bedpan, or urinal?: A Little Help from another person bathing (including washing, rinsing, drying)?: A Little Help from another person to put on and taking off regular upper body clothing?: None Help from another person to put on and taking off regular lower body clothing?: A Little 6 Click Score: 20    End of Session    OT Visit Diagnosis: Unsteadiness on feet (R26.81);Other abnormalities of gait and mobility (R26.89);Muscle weakness (generalized) (M62.81);Pain Pain - part of body: (Generalized)   Activity Tolerance Patient tolerated treatment well;Patient limited by fatigue   Patient Left in chair;with call bell/phone within reach;with chair alarm set   Nurse Communication Mobility status        Time: 1531-1551 OT Time Calculation (min): 20 min  Charges: OT General Charges $OT Visit: 1 Visit OT Treatments $Self Care/Home Management : 8-22 mins  [place], OTR/L Acute Rehab Pager: [phone] Office: [place] 04/30/2019, 4:28 PM

## 2019-04-30 NOTE — Progress Notes (Addendum)
Renal Navigator received call from [facility] HD Director of Operations/A. Jacqualin Combes who states she has looked at staffing and scheduling in the [facility] clinic to see if there was any way to run a small COVID shift for patient at his home clinic, but at this time, there is no way to accommodate this due to already having PUI patients and not being able to mix PUI with a known positive patient.  She has reached out to the Director of Social Work to see about resources and suggests that there may be gas money to offer the family. She asked that Renal Navigator contact patient's daughter to see if this would be an option for her if it can be secured by [facility] in this difficult case. Renal Navigator called patient's daughter, who was expecting a call from Navigator this afternoon, but she did not answer. Navigator left a message on her voicemail, which identified her, requesting a call back as soon as possible.  Renal Navigator updated Attending and CM. Patient has a seat at [facility], TTS isolation shift 12:00pm if he can get transportation. He would need to arrive at 11:45am and wait in the parking lot until he is called in. If patient's daughter agrees to take him, he can discharge. Notify Renal PA so that orders can be sent and OP HD clinic can be informed.  Alphonzo Cruise, [place] Renal Navigator [phone]

## 2019-05-01 DIAGNOSIS — R7401 Elevation of levels of liver transaminase levels: Secondary | ICD-10-CM

## 2019-05-01 LAB — RENAL FUNCTION PANEL
Albumin: 2.6 g/dL — ABNORMAL LOW (ref 3.5–5.0)
Anion gap: 13 (ref 5–15)
BUN: 39 mg/dL — ABNORMAL HIGH (ref 8–23)
CO2: 27 mmol/L (ref 22–32)
Calcium: 7.5 mg/dL — ABNORMAL LOW (ref 8.9–10.3)
Chloride: 97 mmol/L — ABNORMAL LOW (ref 98–111)
Creatinine, Ser: 5.13 mg/dL — ABNORMAL HIGH (ref 0.61–1.24)
GFR calc Af Amer: 11 mL/min — ABNORMAL LOW (ref >60)
GFR calc non Af Amer: 9 mL/min — ABNORMAL LOW (ref >60)
Glucose, Bld: 96 mg/dL (ref 70–99)
Phosphorus: 4.3 mg/dL (ref 2.5–4.6)
Potassium: 4.2 mmol/L (ref 3.5–5.1)
Sodium: 137 mmol/L (ref 135–145)

## 2019-05-01 NOTE — Progress Notes (Addendum)
  Date: 05/01/2019  Patient name: Timothy Carney  Medical record number: [id number]  Date of birth: 1926/05/13        I have seen and evaluated this patient and I have discussed the plan of care with the house staff.   Subjective: Timothy Carney had complaints of intermittent coughing spells this morning.  He ate a good breakfast and then took a nap.  He denies nausea, abdominal pain, shortness of breath, chest pain.  He agrees that he is able to walk around and is stable on his feet.  He understands the plan for possible discharge today and feels he will do well at home.  He dialyzed last night at about 10 PM.  Social history: He is of Carney ancestry and he and Timothy Carney ancestors.  He has never been able to visit [place].  Objective: Afebrile, HR 67, RR 15, BP 126/47, 95% sat on RA General no acute distress  Respiratory able to speak in full sentences without any respiratory distress.  No accessory muscle usage.  No tachypnea Cardiovascular regular pulse, no lower extremity edema Abdomen no tenderness to palpation Skin warm and dry  Labs:  CO2 27, Phos 4.3, Alb 2.6, HgB stable on 11/24 at 9.5  Assessment/plan:  1.  Acute hypoxic respiratory failure secondary to COVID-19: -Hypoxia resolved -Completed 5 day course of remdesivir -Has received 8 days of dexamethasone, course is complete  -As still infectious, he needs total 20 day self isolation, mask use at hemodialysis  2.  ESRD on hemodialysis: -nl HD TTS -HD last night per Timothy Carney -Has outpt HD center at Timothy Carney (not usual HD center) with HD on the 28th if daughter can transport him Carney. Renal navigator has left message with daughter to clarify.  3.  Hypertension: -BP at goal -Metoprolol 12.5 mg nightly   4. Elevated AST/ALT - in setting of remdesivir, improved after discontinuation of remdesivir, repeat AST and ALT needed at hospital follow up  Dispo: D/C today if daughter can transport to Timothy Carney HD  starting on 28th.  Home health services.  I personally spent over 30 minutes reviewing chart, documentation, face-to-face time with the patient, education, and informing patient of plans.  Timothy Crews, MD 05/01/2019, 10:10 AM

## 2019-05-01 NOTE — Progress Notes (Signed)
Subjective:  Patient going home today.  We have found taxi that will take patient from his daughter's place in [place] to the [place] shift dialysis on tues - thurs - Sat 2nd shift at [facility] in [place].  He will not be going to back to [facility] HD until the COVID illness is officially behind [place].   Objective Vital signs in last 24 hours: Vitals:   04/30/19 2119 04/30/19 2300 04/30/19 2300 05/01/19 0807  BP: 130/70 (!) 101/48 (!) 101/48 (!) 126/47  Pulse: 72 66 66 67  Resp: 18   15  Temp: 98 F (36.7 C) 98.3 F (36.8 C) 98.3 F (36.8 C) 98.4 F (36.9 C)  TempSrc: Oral Oral Oral Oral  SpO2: 95% 93% 93% 95%  Weight: 78 kg      Weight change:   Intake/Output Summary (Last 24 hours) at 05/01/2019 1433 Last data filed at 05/01/2019 0946 Gross per 24 hour  Intake 480 ml  Output 1000 ml  Net -520 ml   Exam  mild rales L only, R clear   No jvd   Siting up in chair in street [place]    Cor reg no RG    abd soft ntnd    Ext trace edema   Dialysis: [facility]  4h  2/2.25 bath  76.5kg   RUA AVF  Hep 3000 Calcitriol  0.75 mcg po /HD  MIrcera 30 mcg q 2wks hd ( last given 04/23/19 )  Venofer  50 mg q wkly     Assessment/Plan: 1. Acute Hypoxemic Resp Failure 2/2 COVID- Management per admit team. Going home today.  2. ESRD -HD TTS.  We have found taxi ([employer]) that will take patient from his daughter's place in [place] (pt staying w/ her temporarily) to the COVID shift dialysis on tues - thurs - Sat 2nd shift at [facility] in [place].  He will not be going to back to [facility] HD until the COVID illness is officially behind [place]. I have discussed w/ daughter who understands today.  3. Hypertension/volume  - lowered Lopressor 25mg  bid to 12.5 qhs. 1.5 Kg over dry wt. Maintain same edw at dc.  4. Anemia of ESRD  - hgb 11.7 dropped now to mid 9's.   No obvious bleeding noted.  OP Mircera  given 11/18 as OP-  Continuing iron  5. Metabolic bone disease -  Po rocaltrol/ renvela binder /  sensipar - cont for now 6. Dispo - OK for dc from renal standpoint    Kelly Splinter, MD 05/01/2019, 2:33 PM        Labs: Basic Metabolic Panel: Recent Labs  Lab 04/28/19 0541 04/29/19 0510 04/30/19 0410 05/01/19 0410  NA 139 137 136 137  K 4.9 4.3 4.7 4.2  CL 99 95* 94* 97*  CO2 26 28 27 27   GLUCOSE 116* 89 105* 96  BUN 77* 49* 73* 39*  CREATININE 8.54* 6.09* 7.84* 5.13*  CALCIUM 7.9* 8.0* 7.7* 7.5*  PHOS 4.6 4.4  --  4.3   Liver Function Tests: Recent Labs  Lab 04/28/19 0541 04/29/19 0510 04/30/19 0410 05/01/19 0410  AST 107* 142* 96*  --   ALT 88* 151* 148*  --   ALKPHOS 97 103 97  --   BILITOT 0.9 1.0 0.9  --   PROT 5.7* 5.6* 5.4*  --   ALBUMIN 2.6* 2.7* 2.7* 2.6*   No results for input(s): LIPASE, AMYLASE in the last 168 hours. No results for input(s): AMMONIA in the last 168  hours. CBC: Recent Labs  Lab 04/26/19 0727 04/27/19 0341 04/28/19 0755 04/29/19 0510  WBC 8.0 7.3 8.7 9.8  NEUTROABS  --   --   --  8.0*  HGB 9.2* 9.2* 9.3* 9.5*  HCT 28.4* 28.8* 29.4* 29.2*  MCV 97.3 97.6 97.7 97.7  PLT 198 199 240 237   Cardiac Enzymes: No results for input(s): CKTOTAL, CKMB, CKMBINDEX, TROPONINI in the last 168 hours. CBG: No results for input(s): GLUCAP in the last 168 hours.  Iron Studies:  No results for input(s): IRON, TIBC, TRANSFERRIN, FERRITIN in the last 72 hours. Studies/Results: No results found. Medications: Infusions: . ferric gluconate (FERRLECIT/NULECIT) IV Stopped (04/29/19 1930)    Scheduled Medications: . allopurinol  200 mg Oral Daily  . aspirin EC  81 mg Oral Daily  . calcitRIOL  0.75 mcg Oral Q T,Th,Sa-HD  . Chlorhexidine Gluconate Cloth  6 each Topical Q0600  . Chlorhexidine Gluconate Cloth  6 each Topical Q0600  . cinacalcet  30 mg Oral Q1200  . dexamethasone  6 mg Oral Daily  . heparin  5,000 Units Subcutaneous Q8H  . metoprolol tartrate  12.5 mg Oral QHS  . multivitamin  1 tablet Oral QHS  . sevelamer carbonate   1,600 mg Oral TID WC  . sodium chloride flush  3 mL Intravenous Q12H  . tamsulosin  0.4 mg Oral BID    have reviewed scheduled and prn medications.

## 2019-05-01 NOTE — TOC Transition Note (Signed)
Transition of Care [facility]) - CM/SW Discharge Note   Patient Details  Name: Timothy Carney MRN: [id number] Date of Birth: [birth year]  [facility] of [facility]) CM/SW Contact:  Pollie Friar, RN Phone Number: 05/01/2019, 11:56 AM   Clinical Narrative:    CM received a phone call from [facility], renal coordinator that she has been able to arrange transportation for the patient to HD from [place] and back home each session. [employer] will be providing the transportation. CM has updated the MD and daughter: Timothy Carney. This service will start on Saturday and pick up the patient between 10:30-10:45 and take him home after HD.  MD is agreeable for d/c home today. CM has updated Timothy Carney and patients son will provide transport home.  Bedside RN updated.    Final next level of care: Home w Home Health Services Barriers to Discharge: No Barriers Identified   Patient Goals and CMS Choice   CMS Medicare.gov Compare Post Acute Care list provided to:: Patient Represenative (must comment) Choice offered to / list presented to : Adult Children  Discharge Placement                       Discharge Plan and Services     Post Acute Care Choice: Home Health                    HH Arranged: PT [facility] Agency: [place] Date [facility] Agency Contacted: 04/29/19 Time Ohanna Gassert: [phone] Representative spoke with at [place]: cory  Social Determinants of Health ([place]) Interventions     Readmission Risk Interventions No flowsheet data found.

## 2019-05-02 ENCOUNTER — Inpatient Hospital Stay
Admission: AD | Admit: 2019-05-02 | Payer: Medicare HMO | Source: Other Acute Inpatient Hospital | Admitting: Internal Medicine

## 2019-05-02 ENCOUNTER — Telehealth: Payer: Self-pay | Admitting: Physician Assistant

## 2019-05-02 NOTE — Telephone Encounter (Signed)
Transition of care contact from inpatient facility  Date of discharge: 05/01/19 Date of contact: 05/02/19 Method of contact: phone  Attempted to contact patient to discuss transition of care from inpatient admission. Patient did not answer the phone. Unable to leave VM. Will follow up at outpatient HD unit.

## 2019-06-13 ENCOUNTER — Other Ambulatory Visit: Payer: Medicare HMO

## 2019-07-21 ENCOUNTER — Ambulatory Visit: Payer: Medicare HMO | Admitting: Cardiology

## 2019-07-22 ENCOUNTER — Encounter: Payer: Self-pay | Admitting: Cardiology

## 2019-07-22 ENCOUNTER — Ambulatory Visit (INDEPENDENT_AMBULATORY_CARE_PROVIDER_SITE_OTHER): Payer: Medicare HMO | Admitting: Cardiology

## 2019-07-22 ENCOUNTER — Other Ambulatory Visit: Payer: Self-pay

## 2019-07-22 VITALS — BP 104/70 | HR 77 | Ht 69.0 in | Wt 159.6 lb

## 2019-07-22 DIAGNOSIS — I4729 Other ventricular tachycardia: Secondary | ICD-10-CM

## 2019-07-22 DIAGNOSIS — Z950 Presence of cardiac pacemaker: Secondary | ICD-10-CM | POA: Diagnosis not present

## 2019-07-22 DIAGNOSIS — I472 Ventricular tachycardia: Secondary | ICD-10-CM

## 2019-07-22 DIAGNOSIS — I495 Sick sinus syndrome: Secondary | ICD-10-CM

## 2019-07-22 DIAGNOSIS — I471 Supraventricular tachycardia: Secondary | ICD-10-CM | POA: Diagnosis not present

## 2019-07-22 NOTE — Addendum Note (Signed)
Addended by: Ashok Norris on: 07/22/2019 02:39 PM   Modules accepted: Orders

## 2019-07-22 NOTE — Progress Notes (Signed)
Cardiology Office Note:    Date:  07/22/2019   ID:  Timothy Carney, DOB 1925/10/25, MRN [id number]  PCP:  [name], MD  Cardiologist:  Jenne Campus, MD    Referring MD: [name], MD   No chief complaint on file. I had a COVID-19 infection  History of Present Illness:    Timothy Carney is a 86 y.o. male with tachybradycardia syndrome status post pacemaker implantation which is a Medtronic device, also chronic kidney failure on dialysis, essential hypertension comes today to my office for follow-up.  Recent interrogation of his device showed presence of nonsustained ventricular tachycardia.  He is asymptomatic, no passing out however he does have some dizziness especially when he gets up very quickly.  He denies having any chest pain, tightness, pressure, burning in the chest feels very weak and tired during dialysis and sometimes he does have some sensation on the left side of his chest during dialysis.  He was given metoprolol 25 twice daily however was not able to tolerate it because of low blood pressure during dialysis  About a month and a half ago he ended up getting COVID-19 infection.  He was in the hospital for a few weeks.  He did quite well considering.  He said now he is almost back to himself.  Complain of being weak tired and short of breath quite easily but considering all the situation I think he is doing quite well.  Denies have any palpitations, no passing out no dizziness no chest pain.  Cardiac wise doing well  Past Medical History:  Diagnosis Date  . Anemia   . Anxiety    Takes Ativan  . Benign hypertension 01/06/2016  . Blood transfusion 2012  . Chronic kidney disease    FOLLOWED BY DR Moshe Cipro  . Chronic kidney disease, stage 5, kidney failure ([place]) 01/06/2016   managed NEPH 2016: 12 2017: dialysis  . Chronic pain syndrome 05/16/2016   2016: onset 2017: opiates  . Diverticulitis 2012  . Diverticulosis of colon 01/06/2016   2016: diverticulitis   . End stage renal disease ([place]) 07/13/2011  . GERD (gastroesophageal reflux disease)   . Gout 01/06/2016  . Hyperlipidemia, mixed 01/06/2016  . Hypertension    takes Metoprolol but pt states it makes blood pressure drop;   Marland Kitchen Mechanical complication of other vascular device, implant, and graft 09/21/2011  . Pacemaker 07/12/10   Dual Chamber Pacemaker ( Dr. Agustin Cree at [facility])  . Pneumonia April 2012  . PSVT (paroxysmal supraventricular tachycardia) ([place]) 01/06/2016  . Secondary hyperparathyroidism ([place])   . Sick sinus syndrome ([place]) 01/06/2016   pacemaker    Past Surgical History:  Procedure Laterality Date  . AV FISTULA PLACEMENT  07/17/2011   Procedure: ARTERIOVENOUS (AV) FISTULA CREATION;  Surgeon: Elam Dutch, MD;  Location: [facility] OR;  Service: Vascular;  Laterality: Right;  . AV FISTULA PLACEMENT  10/04/2011   Procedure: ARTERIOVENOUS (AV) FISTULA CREATION;  Surgeon: Elam Dutch, MD;  Location: [place];  Service: Vascular;;  . Lillard Anes    . HERNIA REPAIR    . INSERT / REPLACE / REMOVE PACEMAKER  07/2010  . PROSTATE SURGERY      Current Medications: Current Meds  Medication Sig  . acetaminophen (TYLENOL) 500 MG tablet Take 500 mg by mouth every 6 (six) hours as needed for headache (pain).  Marland Kitchen ALPRAZolam (XANAX) 1 MG tablet Take 1 mg by mouth at bedtime.  Marland Kitchen aspirin EC 81 MG tablet Take  81 mg by mouth daily.  . cinacalcet (SENSIPAR) 30 MG tablet Take 30 mg by mouth at bedtime. Do not take less than 12 hours prior to dialysis  . diphenhydrAMINE (BENADRYL) 25 MG tablet Take 50 mg by mouth at bedtime as needed for sleep.  . metoprolol tartrate (LOPRESSOR) 25 MG tablet Take 12.5 mg by mouth 2 (two) times daily.  . mometasone (ELOCON) 0.1 % cream Apply 1 application topically daily as needed (itching).  . Multiple Vitamin (MULTIVITAMIN WITH MINERALS) TABS tablet Take 1 tablet by mouth daily.  . nitroGLYCERIN (NITROSTAT) 0.4 MG SL tablet Place 0.4 mg under the tongue every 5  (five) minutes as needed for chest pain.  Marland Kitchen ondansetron (ZOFRAN) 4 MG tablet Take 1 tablet by mouth every 8 (eight) hours as needed for nausea.   . sevelamer carbonate (RENVELA) 800 MG tablet Take 1,600 mg by mouth 3 (three) times daily with meals.   . traMADol (ULTRAM) 50 MG tablet Take 50 mg by mouth every 6 (six) hours as needed (pain).  . vitamin B-12 (CYANOCOBALAMIN) 1000 MCG tablet Take 1,000 mcg by mouth daily.  . vitamin C (ASCORBIC ACID) 500 MG tablet Take 500 mg by mouth daily.  . vitamin E 400 UNIT capsule Take 400 Units by mouth daily. 180 mcg     Allergies:   Patient has no known allergies.   Social History   Socioeconomic History  . Marital status: Married    Spouse name: Not on file  . Number of children: Not on file  . Years of education: Not on file  . Highest education level: Not on file  Occupational History  . Not on file  Tobacco Use  . Smoking status: Former Smoker    Packs/day: 1.50    Years: 20.00    Pack years: 30.00    Types: Cigarettes    Quit date: 11/16/1959    Years since quitting: 59.7  . Smokeless tobacco: Never Used  Substance and Sexual Activity  . Alcohol use: No  . Drug use: No  . Sexual activity: Not on file  Other Topics Concern  . Not on file  Social History Narrative  . Not on file   Social Determinants of Health   Financial Resource Strain:   . Difficulty of Paying Living Expenses: Not on file  Food Insecurity:   . Worried About Charity fundraiser in the Last Year: Not on file  . Ran Out of Food in the Last Year: Not on file  Transportation Needs:   . Lack of Transportation (Medical): Not on file  . Lack of Transportation (Non-Medical): Not on file  Physical Activity:   . Days of Exercise per Week: Not on file  . Minutes of Exercise per Session: Not on file  Stress:   . Feeling of Stress : Not on file  Social Connections:   . Frequency of Communication with Friends and Family: Not on file  . Frequency of Social  Gatherings with Friends and Family: Not on file  . Attends Religious Services: Not on file  . Active Member of Clubs or Organizations: Not on file  . Attends Archivist Meetings: Not on file  . Marital Status: Not on file     Family History: The patient's family history includes Diabetes in his sister; Heart disease in his mother; Hypertension in his brother, daughter, father, sister, and son; Rheum arthritis in his mother and sister; Stroke in his father. There is no history of Anesthesia  problems, Hypotension, Malignant hyperthermia, or Pseudochol deficiency. ROS:   Please see the history of present illness.    All 14 point review of systems negative except as described per history of present illness  EKGs/Labs/Other Studies Reviewed:      Recent Labs: 04/29/2019: Hemoglobin 9.5; Platelets 237 04/30/2019: ALT 148 05/01/2019: BUN 39; Creatinine, Ser 5.13; Potassium 4.2; Sodium 137  Recent Lipid Panel    Component Value Date/Time   TRIG 98 04/24/2019 0019    Physical Exam:    VS:  BP 104/70   Pulse 77   Ht 5\' 9"  (1.753 m)   Wt 159 lb 9.6 oz (72.4 kg)   SpO2 98%   BMI 23.57 kg/m     Wt Readings from Last 3 Encounters:  07/22/19 159 lb 9.6 oz (72.4 kg)  04/30/19 171 lb 15.3 oz (78 kg)  04/21/19 167 lb (75.8 kg)     GEN:  Well nourished, well developed in no acute distress HEENT: Normal NECK: No JVD; No carotid bruits LYMPHATICS: No lymphadenopathy CARDIAC: RRR, no murmurs, no rubs, no gallops RESPIRATORY:  Clear to auscultation without rales, wheezing or rhonchi  ABDOMEN: Soft, non-tender, non-distended MUSCULOSKELETAL:  No edema; No deformity  SKIN: Warm and dry LOWER EXTREMITIES: no swelling NEUROLOGIC:  Alert and oriented x 3 PSYCHIATRIC:  Normal affect   ASSESSMENT:    1. Nonsustained ventricular tachycardia (HCC)   2. PSVT (paroxysmal supraventricular tachycardia) ([place])   3. Sick sinus syndrome ([place])   4. Pacemaker    PLAN:    In order of  problems listed above:  1. Nonsustained ventricular tachycardia.  On beta-blocker.  Denies having any passing out spells.  Continue present management.  We will make arrangements for his pacemaker to be interrogated. 2. SVT.  We will renew his metoprolol 12.5 twice daily. 3. Sick sinus syndrome that being addressed with a pacemaker 4. Pacemaker present is a Medtronic device, will make arrangements for pacemaker interrogation. 5. Chronic kidney failure he is on dialysis 3 times a week.  Overall looks like he did quite well with his COVID-19 infection because of his age and comorbidity his risk of poor outcome was very high with this infection however, he did quite well.   Medication Adjustments/Labs and Tests Ordered: Current medicines are reviewed at length with the patient today.  Concerns regarding medicines are outlined above.  No orders of the defined types were placed in this encounter.  Medication changes: No orders of the defined types were placed in this encounter.   Signed, Park Liter, MD, [facility] 07/22/2019 2:31 PM    [place]

## 2019-07-22 NOTE — Patient Instructions (Signed)
Medication Instructions:  Your physician recommends that you continue on your current medications as directed. Please refer to the Current Medication list given to you today.  *If you need a refill on your cardiac medications before your next appointment, please call your pharmacy*  Lab Work: None.  If you have labs (blood work) drawn today and your tests are completely normal, you will receive your results only by: . MyChart Message (if you have MyChart) OR . A paper copy in the mail If you have any lab test that is abnormal or we need to change your treatment, we will call you to review the results.  Testing/Procedures: Your physician has requested that you have an echocardiogram. Echocardiography is a painless test that uses sound waves to create images of your heart. It provides your doctor with information about the size and shape of your heart and how well your heart's chambers and valves are working. This procedure takes approximately one hour. There are no restrictions for this procedure.    Follow-Up: At [facility], you and your health needs are our priority.  As part of our continuing mission to provide you with exceptional heart care, we have created designated Provider Care Teams.  These Care Teams include your primary Cardiologist (physician) and Advanced Practice Providers (APPs -  Physician Assistants and Nurse Practitioners) who all work together to provide you with the care you need, when you need it.  Your next appointment:   4 month(s)  The format for your next appointment:   In Person  Provider:   Robert Krasowski, MD  Other Instructions   Echocardiogram An echocardiogram is a procedure that uses painless sound waves (ultrasound) to produce an image of the heart. Images from an echocardiogram can provide important information about:  Signs of coronary artery disease (CAD).  Aneurysm detection. An aneurysm is a weak or damaged part of an artery wall that  bulges out from the normal force of blood pumping through the body.  Heart size and shape. Changes in the size or shape of the heart can be associated with certain conditions, including heart failure, aneurysm, and CAD.  Heart muscle function.  Heart valve function.  Signs of a past heart attack.  Fluid buildup around the heart.  Thickening of the heart muscle.  A tumor or infectious growth around the heart valves. Tell a health care provider about:  Any allergies you have.  All medicines you are taking, including vitamins, herbs, eye drops, creams, and over-the-counter medicines.  Any blood disorders you have.  Any surgeries you have had.  Any medical conditions you have.  Whether you are pregnant or may be pregnant. What are the risks? Generally, this is a safe procedure. However, problems may occur, including:  Allergic reaction to dye (contrast) that may be used during the procedure. What happens before the procedure? No specific preparation is needed. You may eat and drink normally. What happens during the procedure?   An IV tube may be inserted into one of your veins.  You may receive contrast through this tube. A contrast is an injection that improves the quality of the pictures from your heart.  A gel will be applied to your chest.  A wand-like tool (transducer) will be moved over your chest. The gel will help to transmit the sound waves from the transducer.  The sound waves will harmlessly bounce off of your heart to allow the heart images to be captured in real-time motion. The images will be recorded   on a computer. The procedure may vary among health care providers and hospitals. What happens after the procedure?  You may return to your normal, everyday life, including diet, activities, and medicines, unless your health care provider tells you not to do that. Summary  An echocardiogram is a procedure that uses painless sound waves (ultrasound) to produce  an image of the heart.  Images from an echocardiogram can provide important information about the size and shape of your heart, heart muscle function, heart valve function, and fluid buildup around your heart.  You do not need to do anything to prepare before this procedure. You may eat and drink normally.  After the echocardiogram is completed, you may return to your normal, everyday life, unless your health care provider tells you not to do that. This information is not intended to replace advice given to you by your health care provider. Make sure you discuss any questions you have with your health care provider. Document Revised: 09/12/2018 Document Reviewed: 06/24/2016 Elsevier Patient Education  [place].

## 2019-07-23 ENCOUNTER — Ambulatory Visit: Payer: Medicare HMO | Admitting: Cardiology

## 2019-08-01 ENCOUNTER — Telehealth: Payer: Self-pay

## 2019-08-01 MED ORDER — METOPROLOL TARTRATE 25 MG PO TABS: 12.5000 mg | ORAL_TABLET | Freq: Two times a day (BID) | ORAL | 1 refills | Status: DC

## 2019-08-01 NOTE — Telephone Encounter (Signed)
Pt son Griffith Citron) states provider was to send medications to pharmacy after visit yesterday but none rcvd at [address]. Please advise meds to continue and call them in.

## 2019-08-01 NOTE — Telephone Encounter (Signed)
Called patient, he confirmed he needed a refill on metoprolol tartrate 12.5 mg twice daily. Refilled this for him. No further questions.

## 2019-09-08 ENCOUNTER — Other Ambulatory Visit: Payer: Medicare HMO

## 2019-09-17 ENCOUNTER — Ambulatory Visit (INDEPENDENT_AMBULATORY_CARE_PROVIDER_SITE_OTHER): Payer: Medicare HMO | Admitting: *Deleted

## 2019-09-17 DIAGNOSIS — Z950 Presence of cardiac pacemaker: Secondary | ICD-10-CM

## 2019-09-17 LAB — CUP PACEART REMOTE DEVICE CHECK
Battery Voltage: 2.85 V
Brady Statistic AP VP Percent: 1.22 %
Brady Statistic AP VS Percent: 73.52 %
Brady Statistic AS VP Percent: 0.13 %
Brady Statistic AS VS Percent: 25.14 %
Brady Statistic RA Percent Paced: 70.27 %
Brady Statistic RV Percent Paced: 1.48 %
Date Time Interrogation Session: 20210414134837
Implantable Lead Implant Date: 20120207
Implantable Lead Implant Date: 20120207
Implantable Lead Location: 753859
Implantable Lead Location: 753860
Implantable Pulse Generator Implant Date: 20120207
Lead Channel Impedance Value: 356 Ohm
Lead Channel Impedance Value: 440 Ohm
Lead Channel Sensing Intrinsic Amplitude: 1.408 mV
Lead Channel Sensing Intrinsic Amplitude: 12.05 mV
Lead Channel Setting Pacing Amplitude: 2 V
Lead Channel Setting Pacing Amplitude: 2.5 V
Lead Channel Setting Pacing Pulse Width: 0.4 ms
Lead Channel Setting Sensing Sensitivity: 0.9 mV

## 2019-09-18 ENCOUNTER — Telehealth: Payer: Self-pay | Admitting: Student

## 2019-09-18 NOTE — Telephone Encounter (Signed)
Spoke with patient daughter.    MDT brady services estimates 3-6 months or less of battery life. Recommend monthly battery checks. Will also forward to scheduling for visit with Dr. Curt Bears in [place] in 2-3 months.   Legrand Como [address]" [place], PA-C  09/18/2019 10:28 AM

## 2019-09-18 NOTE — Progress Notes (Signed)
PPM Remote  

## 2019-09-19 ENCOUNTER — Telehealth: Payer: Self-pay | Admitting: Cardiology

## 2019-09-19 NOTE — Telephone Encounter (Signed)
New Message  Pt c/o of Chest Pain: STAT if CP now or developed within 24 hours  1. Are you having CP right now? Yes  2. Are you experiencing any other symptoms (ex. SOB, nausea, vomiting, sweating)? SOB  3. How long have you been experiencing CP? No  4. Is your CP continuous or coming and going? Coming and going  5. Have you taken Nitroglycerin? No ?

## 2019-09-19 NOTE — Telephone Encounter (Signed)
If it is still ongoing he needs to go to ER

## 2019-09-19 NOTE — Telephone Encounter (Signed)
Pt was currently asleep. Family advised to take pt to the ED for continuous pain, shortness of breath or other concerns. Family verbalized understanding and had no additional questions.

## 2019-09-25 ENCOUNTER — Other Ambulatory Visit: Payer: Medicare HMO

## 2019-10-20 ENCOUNTER — Ambulatory Visit (INDEPENDENT_AMBULATORY_CARE_PROVIDER_SITE_OTHER): Payer: Medicare HMO | Admitting: *Deleted

## 2019-10-20 DIAGNOSIS — I4729 Other ventricular tachycardia: Secondary | ICD-10-CM

## 2019-10-20 DIAGNOSIS — I495 Sick sinus syndrome: Secondary | ICD-10-CM

## 2019-10-20 DIAGNOSIS — I472 Ventricular tachycardia: Secondary | ICD-10-CM

## 2019-10-21 ENCOUNTER — Telehealth: Payer: Self-pay

## 2019-10-21 NOTE — Telephone Encounter (Signed)
Spoke with patient to remind of missed remote transmission 

## 2019-10-23 LAB — CUP PACEART REMOTE DEVICE CHECK
Battery Voltage: 2.85 V
Brady Statistic AP VP Percent: 3.86 %
Brady Statistic AP VS Percent: 69 %
Brady Statistic AS VP Percent: 0.74 %
Brady Statistic AS VS Percent: 26.39 %
Brady Statistic RA Percent Paced: 68.58 %
Brady Statistic RV Percent Paced: 5.04 %
Date Time Interrogation Session: 20210519170610
Implantable Lead Implant Date: 20120207
Implantable Lead Implant Date: 20120207
Implantable Lead Location: 753859
Implantable Lead Location: 753860
Implantable Pulse Generator Implant Date: 20120207
Lead Channel Impedance Value: 368 Ohm
Lead Channel Impedance Value: 448 Ohm
Lead Channel Sensing Intrinsic Amplitude: 1.32 mV
Lead Channel Setting Pacing Amplitude: 2 V
Lead Channel Setting Pacing Amplitude: 2.5 V
Lead Channel Setting Pacing Pulse Width: 0.4 ms
Lead Channel Setting Sensing Sensitivity: 0.9 mV

## 2019-10-24 NOTE — Addendum Note (Signed)
Addended by: Douglass Rivers D on: 10/24/2019 10:50 AM   Modules accepted: Level of Service

## 2019-10-24 NOTE — Progress Notes (Signed)
Remote pacemaker transmission.   

## 2019-11-05 ENCOUNTER — Ambulatory Visit: Payer: Medicare HMO | Admitting: Cardiology

## 2019-11-24 ENCOUNTER — Ambulatory Visit: Payer: Medicare HMO | Admitting: Cardiology

## 2019-11-26 ENCOUNTER — Ambulatory Visit: Payer: Medicare HMO | Admitting: Cardiology

## 2019-12-15 ENCOUNTER — Encounter: Payer: Medicare HMO | Admitting: Cardiology

## 2020-01-22 ENCOUNTER — Ambulatory Visit (INDEPENDENT_AMBULATORY_CARE_PROVIDER_SITE_OTHER): Payer: Medicare HMO | Admitting: *Deleted

## 2020-01-22 DIAGNOSIS — I495 Sick sinus syndrome: Secondary | ICD-10-CM

## 2020-01-25 LAB — CUP PACEART REMOTE DEVICE CHECK
Battery Voltage: 2.82 V
Brady Statistic AP VP Percent: 0.66 %
Brady Statistic AP VS Percent: 76.81 %
Brady Statistic AS VP Percent: 0.09 %
Brady Statistic AS VS Percent: 22.43 %
Brady Statistic RA Percent Paced: 75.77 %
Brady Statistic RV Percent Paced: 0.88 %
Date Time Interrogation Session: 20210820132155
Implantable Lead Implant Date: 20120207
Implantable Lead Implant Date: 20120207
Implantable Lead Location: 753859
Implantable Lead Location: 753860
Implantable Pulse Generator Implant Date: 20120207
Lead Channel Impedance Value: 360 Ohm
Lead Channel Impedance Value: 432 Ohm
Lead Channel Sensing Intrinsic Amplitude: 1.584 mV
Lead Channel Setting Pacing Amplitude: 2 V
Lead Channel Setting Pacing Amplitude: 2.5 V
Lead Channel Setting Pacing Pulse Width: 0.4 ms
Lead Channel Setting Sensing Sensitivity: 0.9 mV

## 2020-01-26 NOTE — Addendum Note (Signed)
Addended by: Cheri Kearns A on: 01/26/2020 08:20 AM   Modules accepted: Level of Service

## 2020-01-26 NOTE — Progress Notes (Signed)
Post Menstrual Age: <Gestational Age is not set>

## 2020-02-16 ENCOUNTER — Encounter: Payer: Self-pay | Admitting: Cardiology

## 2020-02-16 ENCOUNTER — Ambulatory Visit (INDEPENDENT_AMBULATORY_CARE_PROVIDER_SITE_OTHER): Payer: Medicare HMO | Admitting: Cardiology

## 2020-02-16 ENCOUNTER — Other Ambulatory Visit: Payer: Self-pay

## 2020-02-16 VITALS — BP 126/66 | HR 78 | Ht 69.0 in | Wt 168.0 lb

## 2020-02-16 DIAGNOSIS — Z950 Presence of cardiac pacemaker: Secondary | ICD-10-CM | POA: Diagnosis not present

## 2020-02-16 DIAGNOSIS — I495 Sick sinus syndrome: Secondary | ICD-10-CM

## 2020-02-16 NOTE — Progress Notes (Signed)
Electrophysiology Office Note   Date:  02/16/2020   ID:  Timothy Carney, DOB 27-Jan-1926, MRN [id number]  PCP:  [name], MD  Cardiologist:  Agustin Cree Primary Electrophysiologist:  Atiya Yera Meredith Leeds, MD    No chief complaint on file.    History of Present Illness: Timothy Carney is a 86 y.o. male who is being seen today for the evaluation of pacemaker at the request of Dough, Jaymes Graff, MD. Presenting today for electrophysiology evaluation.  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  Today, denies symptoms of palpitations, chest pain, shortness of breath, orthopnea, PND, lower extremity edema, claudication, dizziness, presyncope, syncope, bleeding, or neurologic sequela. The patient is tolerating medications without difficulties.  He is overall doing well.  He continues to do well with dialysis.  He does state that his heart rates have been irregular while at dialysis.  Aside from that he has no complaints.   Past Medical History:  Diagnosis Date  . Anemia   . Anxiety    Takes Ativan  . Benign hypertension 01/06/2016  . Blood transfusion 2012  . Chronic kidney disease    FOLLOWED BY DR Moshe Cipro  . Chronic kidney disease, stage 5, kidney failure ([place]) 01/06/2016   managed NEPH 2016: 12 2017: dialysis  . Chronic pain syndrome 05/16/2016   2016: onset 2017: opiates  . Diverticulitis 2012  . Diverticulosis of colon 01/06/2016   2016: diverticulitis  . End stage renal disease ([place]) 07/13/2011  . GERD (gastroesophageal reflux disease)   . Gout 01/06/2016  . Hyperlipidemia, mixed 01/06/2016  . Hypertension    takes Metoprolol but pt states it makes blood pressure drop;   Marland Kitchen Mechanical complication of other vascular device, implant, and graft 09/21/2011  . Pacemaker 07/12/10   Dual Chamber Pacemaker ( Dr. Agustin Cree at [facility])  . Pneumonia April  2012  . PSVT (paroxysmal supraventricular tachycardia) ([place]) 01/06/2016  . Secondary hyperparathyroidism ([place])   . Sick sinus syndrome ([place]) 01/06/2016   pacemaker   Past Surgical History:  Procedure Laterality Date  . AV FISTULA PLACEMENT  07/17/2011   Procedure: ARTERIOVENOUS (AV) FISTULA CREATION;  Surgeon: Elam Dutch, MD;  Location: [facility] OR;  Service: Vascular;  Laterality: Right;  . AV FISTULA PLACEMENT  10/04/2011   Procedure: ARTERIOVENOUS (AV) FISTULA CREATION;  Surgeon: Elam Dutch, MD;  Location: [place];  Service: Vascular;;  . Lillard Anes    . HERNIA REPAIR    . INSERT / REPLACE / REMOVE PACEMAKER  07/2010  . PROSTATE SURGERY       Current Outpatient Medications  Medication Sig Dispense Refill  . acetaminophen (TYLENOL) 500 MG tablet Take 500 mg by mouth every 6 (six) hours as needed for headache (pain).    Marland Kitchen ALPRAZolam (XANAX) 1 MG tablet Take 1 mg by mouth at bedtime.    Marland Kitchen aspirin EC 81 MG tablet Take 81 mg by mouth daily.    . cinacalcet (SENSIPAR) 30 MG tablet Take 30 mg by mouth at bedtime. Do not take less than 12 hours prior to dialysis  12  . diphenhydrAMINE (BENADRYL) 25 MG tablet Take 50 mg by mouth at bedtime as needed for sleep.    . metoprolol tartrate (LOPRESSOR) 25 MG tablet Take 0.5 tablets (12.5 mg total) by mouth 2 (two) times daily. 90 tablet 1  . mometasone (ELOCON) 0.1 % cream Apply 1 application topically daily  as needed (itching).    . Multiple Vitamin (MULTIVITAMIN WITH MINERALS) TABS tablet Take 1 tablet by mouth daily.    . nitroGLYCERIN (NITROSTAT) 0.4 MG SL tablet Place 0.4 mg under the tongue every 5 (five) minutes as needed for chest pain.    Marland Kitchen ondansetron (ZOFRAN) 4 MG tablet Take 1 tablet by mouth every 8 (eight) hours as needed for nausea.     . sevelamer carbonate (RENVELA) 800 MG tablet Take 1,600 mg by mouth 3 (three) times daily with meals.     . traMADol (ULTRAM) 50 MG tablet Take 50 mg by mouth every 6 (six) hours as needed (pain).     . vitamin B-12 (CYANOCOBALAMIN) 1000 MCG tablet Take 1,000 mcg by mouth daily.    . vitamin C (ASCORBIC ACID) 500 MG tablet Take 500 mg by mouth daily.    . vitamin E 400 UNIT capsule Take 400 Units by mouth daily. 180 mcg     No current facility-administered medications for this visit.    Allergies:   Prilocaine   Social History:  The patient  reports that he quit smoking about 60 years ago. His smoking use included cigarettes. He has a 30.00 pack-year smoking history. He has never used smokeless tobacco. He reports that he does not drink alcohol and does not use drugs.   Family History:  The patient's family history includes Diabetes in his sister; Heart disease in his mother; Hypertension in his brother, daughter, father, sister, and son; Rheum arthritis in his mother and sister; Stroke in his father.    ROS:  Please see the history of present illness.   Otherwise, review of systems is positive for none.   All other systems are reviewed and negative.   PHYSICAL EXAM: VS:  BP 126/66   Pulse 78   Ht 5\' 9"  (1.753 m)   Wt 168 lb (76.2 kg)   SpO2 96%   BMI 24.81 kg/m  , BMI Body mass index is 24.81 kg/m. GEN: Well nourished, well developed, in no acute distress  HEENT: normal  Neck: no JVD, carotid bruits, or masses Cardiac: Irregular; no murmurs, rubs, or gallops,no edema  Respiratory:  clear to auscultation bilaterally, normal work of breathing GI: soft, nontender, nondistended, + BS MS: no deformity or atrophy  Skin: warm and dry, device site well healed Neuro:  Strength and sensation are intact Psych: euthymic mood, full affect  EKG:  EKG is ordered today. Personal review of the ekg ordered shows atrial paced, left axis deviation, rate 78  Personal review of the device interrogation today. Results in [place]: 04/29/2019: Hemoglobin 9.5; Platelets 237 04/30/2019: ALT 148 05/01/2019: BUN 39; Creatinine, Ser 5.13; Potassium 4.2; Sodium 137    Lipid  Panel     Component Value Date/Time   TRIG 98 04/24/2019 0019     Wt Readings from Last 3 Encounters:  02/16/20 168 lb (76.2 kg)  07/22/19 159 lb 9.6 oz (72.4 kg)  04/30/19 171 lb 15.3 oz (78 kg)      Other studies Reviewed: Additional studies/ records that were reviewed today include: TTE 06/10/17  Review of the above records today demonstrates:  - Left ventricle: The cavity size was normal. Systolic function was   normal. The estimated ejection fraction was in the range of 55%   to 60%. Wall motion was normal; there were no regional wall   motion abnormalities. Features are consistent with a pseudonormal   left ventricular filling pattern, with  concomitant abnormal   relaxation and increased filling pressure (grade 2 diastolic   dysfunction). - Aortic valve: There was mild regurgitation. Valve area (VTI):   1.72 cm^2. Valve area (Vmax): 1.54 cm^2. Valve area (Vmean): 1.74   cm^2. - Left atrium: The atrium was moderately dilated. - Right atrium: The atrium was moderately dilated. - Tricuspid valve: There was moderate regurgitation. - Pulmonary arteries: PA peak pressure: 49 mm Hg (S).   ASSESSMENT AND PLAN:  1.  Sick sinus syndrome: Status post Medtronic dual-chamber pacemaker.  Device functioning appropriately.  No changes at this time.  2.  SVT:.  He is minimally symptomatic.  He is having PACs today which could be the cause of his irregularity at dialysis.  No changes.  Current medicines are reviewed at length with the patient today.   The patient does not have concerns regarding his medicines.  The following changes were made today: None  Labs/ tests ordered today include:  Orders Placed This Encounter  Procedures  . EKG 12-Lead     Disposition:   FU with Vear Staton 1 year  Signed, Onie Hayashi Meredith Leeds, MD  02/16/2020 3:42 PM     [place] [address] [place] [address] [phone] (office) [phone] (fax)

## 2020-02-16 NOTE — Patient Instructions (Signed)
Medication Instructions:  Your physician recommends that you continue on your current medications as directed. Please refer to the Current Medication list given to you today.  *If you need a refill on your cardiac medications before your next appointment, please call your pharmacy*   Lab Work: None ordered If you have labs (blood work) drawn today and your tests are completely normal, you will receive your results only by: Marland Kitchen MyChart Message (if you have MyChart) OR . A paper copy in the mail If you have any lab test that is abnormal or we need to change your treatment, we will call you to review the results.   Testing/Procedures: None ordered   Follow-Up: Remote monitoring is used to monitor your Pacemaker of ICD from home. This monitoring reduces the number of office visits required to check your device to one time per year. It allows [place] to keep an eye on the functioning of your device to ensure it is working properly. You are scheduled for a device check from home on 02/23/2020. You may send your transmission at any time that day. If you have a wireless device, the transmission will be sent automatically. After your physician reviews your transmission, you will receive a postcard with your next transmission date.  At [facility], you and your health needs are our priority.  As part of our continuing mission to provide you with exceptional heart care, we have created designated Provider Care Teams.  These Care Teams include your primary Cardiologist (physician) and Advanced Practice Providers (APPs -  Physician Assistants and Nurse Practitioners) who all work together to provide you with the care you need, when you need it.  We recommend signing up for the patient portal called "MyChart".  Sign up information is provided on this After Visit Summary.  MyChart is used to connect with patients for Virtual Visits (Telemedicine).  Patients are able to view lab/test results, encounter notes,  upcoming appointments, etc.  Non-urgent messages can be sent to your provider as well.   To learn more about what you can do with MyChart, go to [email or web address].    Your next appointment:   1 year(s)  The format for your next appointment:   In Person  Provider:   Allegra Lai, MD   Thank you for choosing [facility]!!   Trinidad Curet, RN [phone]    Other Instructions

## 2020-02-23 ENCOUNTER — Ambulatory Visit (INDEPENDENT_AMBULATORY_CARE_PROVIDER_SITE_OTHER): Payer: Medicare HMO | Admitting: *Deleted

## 2020-02-23 DIAGNOSIS — I495 Sick sinus syndrome: Secondary | ICD-10-CM

## 2020-02-24 LAB — CUP PACEART REMOTE DEVICE CHECK
Battery Voltage: 2.82 V
Brady Statistic AP VP Percent: 0.92 %
Brady Statistic AP VS Percent: 74.11 %
Brady Statistic AS VP Percent: 0.19 %
Brady Statistic AS VS Percent: 24.79 %
Brady Statistic RA Percent Paced: 72.48 %
Brady Statistic RV Percent Paced: 1.25 %
Date Time Interrogation Session: 20210921113429
Implantable Lead Implant Date: 20120207
Implantable Lead Implant Date: 20120207
Implantable Lead Location: 753859
Implantable Lead Location: 753860
Implantable Pulse Generator Implant Date: 20120207
Lead Channel Impedance Value: 356 Ohm
Lead Channel Impedance Value: 424 Ohm
Lead Channel Sensing Intrinsic Amplitude: 1.936 mV
Lead Channel Sensing Intrinsic Amplitude: 11.715 mV
Lead Channel Setting Pacing Amplitude: 2 V
Lead Channel Setting Pacing Amplitude: 2.5 V
Lead Channel Setting Pacing Pulse Width: 0.4 ms
Lead Channel Setting Sensing Sensitivity: 0.9 mV

## 2020-02-25 NOTE — Progress Notes (Signed)
Remote pacemaker transmission.   

## 2020-02-25 NOTE — Addendum Note (Signed)
Addended by: Cheri Kearns A on: 02/25/2020 10:02 AM   Modules accepted: Level of Service

## 2020-03-23 ENCOUNTER — Other Ambulatory Visit: Payer: Self-pay | Admitting: Cardiology

## 2020-04-26 ENCOUNTER — Ambulatory Visit (INDEPENDENT_AMBULATORY_CARE_PROVIDER_SITE_OTHER): Payer: Medicare HMO

## 2020-04-26 DIAGNOSIS — I495 Sick sinus syndrome: Secondary | ICD-10-CM

## 2020-04-28 ENCOUNTER — Telehealth: Payer: Self-pay | Admitting: Emergency Medicine

## 2020-04-28 LAB — CUP PACEART REMOTE DEVICE CHECK
Battery Voltage: 2.81 V
Brady Statistic AP VP Percent: 0.2 %
Brady Statistic AP VS Percent: 25.5 %
Brady Statistic AS VP Percent: 47.9 %
Brady Statistic AS VS Percent: 26.4 %
Brady Statistic RA Percent Paced: 24.1 %
Brady Statistic RV Percent Paced: 50.86 %
Date Time Interrogation Session: 20211124133018
Implantable Lead Implant Date: 20120207
Implantable Lead Implant Date: 20120207
Implantable Lead Location: 753859
Implantable Lead Location: 753860
Implantable Pulse Generator Implant Date: 20120207
Lead Channel Impedance Value: 356 Ohm
Lead Channel Impedance Value: 416 Ohm
Lead Channel Sensing Intrinsic Amplitude: 2.112 mV
Lead Channel Setting Pacing Amplitude: 2.5 V
Lead Channel Setting Pacing Pulse Width: 0.4 ms
Lead Channel Setting Sensing Sensitivity: 0.9 mV

## 2020-04-28 NOTE — Telephone Encounter (Signed)
Received transmission that showed device reached RRT on 03/17/20. Spoke with patient and he requested information be given to daughter, [name]. Patient and family member aware that he will be scheduled for appointment ASAP with DR. Camnitz to discuss gen change. Patient has dialysis Tuesday and Thursday.

## 2020-05-03 NOTE — Addendum Note (Signed)
Addended by: Cheri Kearns A on: 05/03/2020 08:13 AM   Modules accepted: Level of Service

## 2020-05-03 NOTE — Progress Notes (Signed)
Remote pacemaker transmission.   

## 2020-05-10 ENCOUNTER — Other Ambulatory Visit: Payer: Self-pay

## 2020-05-10 ENCOUNTER — Ambulatory Visit (INDEPENDENT_AMBULATORY_CARE_PROVIDER_SITE_OTHER): Payer: Medicare HMO | Admitting: Cardiology

## 2020-05-10 ENCOUNTER — Encounter: Payer: Self-pay | Admitting: Cardiology

## 2020-05-10 VITALS — BP 116/58 | HR 86 | Ht 69.0 in | Wt 167.8 lb

## 2020-05-10 DIAGNOSIS — I495 Sick sinus syndrome: Secondary | ICD-10-CM

## 2020-05-10 LAB — CUP PACEART INCLINIC DEVICE CHECK
Battery Voltage: 2.81 V
Brady Statistic AP VP Percent: 0.24 %
Brady Statistic AP VS Percent: 26.72 %
Brady Statistic AS VP Percent: 45.17 %
Brady Statistic AS VS Percent: 27.87 %
Brady Statistic RA Percent Paced: 25.22 %
Brady Statistic RV Percent Paced: 48.4 %
Date Time Interrogation Session: 20211206122300
Implantable Lead Implant Date: 20120207
Implantable Lead Implant Date: 20120207
Implantable Lead Location: 753859
Implantable Lead Location: 753860
Implantable Pulse Generator Implant Date: 20120207
Lead Channel Impedance Value: 360 Ohm
Lead Channel Impedance Value: 448 Ohm
Lead Channel Pacing Threshold Amplitude: 0.5 V
Lead Channel Pacing Threshold Amplitude: 1 V
Lead Channel Pacing Threshold Pulse Width: 0.4 ms
Lead Channel Pacing Threshold Pulse Width: 0.4 ms
Lead Channel Sensing Intrinsic Amplitude: 12.385 mV
Lead Channel Sensing Intrinsic Amplitude: 2.772 mV
Lead Channel Setting Pacing Amplitude: 2.5 V
Lead Channel Setting Pacing Pulse Width: 0.4 ms
Lead Channel Setting Sensing Sensitivity: 0.9 mV

## 2020-05-10 NOTE — H&P (View-Only) (Signed)
Electrophysiology Office Note   Date:  05/10/2020   ID:  Timothy Carney, DOB [birth year], MRN [id number]  PCP:  [name], MD  Cardiologist:  Agustin Cree Primary Electrophysiologist:  Brazen Domangue Meredith Leeds, MD    No chief complaint on file.    History of Present Illness: Timothy Carney is a 86 y.o. male who is being seen today for the evaluation of pacemaker at the request of Dough, Jaymes Graff, MD. Presenting today for electrophysiology evaluation.    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  Device check shows device at RRT.  Today, denies symptoms of palpitations, chest pain, shortness of breath, orthopnea, PND, lower extremity edema, claudication, dizziness, presyncope, syncope, bleeding, or neurologic sequela. The patient is tolerating medications without difficulties.  He is overall feeling well.  He has no chest pain or shortness of breath and is able to do all of his daily activities.  He is tolerating dialysis without issue, though he is having some bleeding from his dialysis site.   Past Medical History:  Diagnosis Date  . Anemia   . Anxiety    Takes Ativan  . Benign hypertension 01/06/2016  . Blood transfusion 2012  . Chronic kidney disease    FOLLOWED BY DR Moshe Cipro  . Chronic kidney disease, stage 5, kidney failure ([place]) 01/06/2016   managed NEPH 2016: 12 2017: dialysis  . Chronic pain syndrome 05/16/2016   2016: onset 2017: opiates  . Diverticulitis 2012  . Diverticulosis of colon 01/06/2016   2016: diverticulitis  . End stage renal disease ([place]) 07/13/2011  . GERD (gastroesophageal reflux disease)   . Gout 01/06/2016  . Hyperlipidemia, mixed 01/06/2016  . Hypertension    takes Metoprolol but pt states it makes blood pressure drop;   Marland Kitchen Mechanical complication of other vascular device, implant, and graft 09/21/2011  . Pacemaker 07/12/10   Dual  Chamber Pacemaker ( Dr. Agustin Cree at [facility])  . Pneumonia April 2012  . PSVT (paroxysmal supraventricular tachycardia) ([place]) 01/06/2016  . Secondary hyperparathyroidism ([place])   . Sick sinus syndrome ([place]) 01/06/2016   pacemaker   Past Surgical History:  Procedure Laterality Date  . AV FISTULA PLACEMENT  07/17/2011   Procedure: ARTERIOVENOUS (AV) FISTULA CREATION;  Surgeon: Elam Dutch, MD;  Location[facility] OR;  Service: Vascular;  Laterality: Right;  . AV FISTULA PLACEMENT  10/04/2011   Procedure: ARTERIOVENOUS (AV) FISTULA CREATION;  Surgeon: Elam Dutch, MD;  Location: [place];  Service: Vascular;;  . Lillard Anes    . HERNIA REPAIR    . INSERT / REPLACE / REMOVE PACEMAKER  07/2010  . PROSTATE SURGERY       Current Outpatient Medications  Medication Sig Dispense Refill  . acetaminophen (TYLENOL) 500 MG tablet Take 500 mg by mouth every 6 (six) hours as needed for headache (pain).    Marland Kitchen ALPRAZolam (XANAX) 1 MG tablet Take 1 mg by mouth at bedtime.    Marland Kitchen aspirin EC 81 MG tablet Take 81 mg by mouth daily.    . cinacalcet (SENSIPAR) 30 MG tablet Take 30 mg by mouth at bedtime. Do not take less than 12 hours prior to dialysis  12  . diphenhydrAMINE (BENADRYL) 25 MG tablet Take 50 mg by mouth at bedtime as needed for sleep.    . metoprolol tartrate (LOPRESSOR) 25 MG tablet Take 0.5 tablets (12.5 mg total) by mouth 2 (two) times  daily. 90 tablet 3  . mometasone (ELOCON) 0.1 % cream Apply 1 application topically daily as needed (itching).    . Multiple Vitamin (MULTIVITAMIN WITH MINERALS) TABS tablet Take 1 tablet by mouth daily.    . nitroGLYCERIN (NITROSTAT) 0.4 MG SL tablet Place 0.4 mg under the tongue every 5 (five) minutes as needed for chest pain.    Marland Kitchen ondansetron (ZOFRAN) 4 MG tablet Take 1 tablet by mouth every 8 (eight) hours as needed for nausea.     . sevelamer carbonate (RENVELA) 800 MG tablet Take 1,600 mg by mouth 3 (three) times daily with meals.     . traMADol  (ULTRAM) 50 MG tablet Take 50 mg by mouth every 6 (six) hours as needed (pain).    . vitamin B-12 (CYANOCOBALAMIN) 1000 MCG tablet Take 1,000 mcg by mouth daily.    . vitamin C (ASCORBIC ACID) 500 MG tablet Take 500 mg by mouth daily.    . vitamin E 400 UNIT capsule Take 400 Units by mouth daily. 180 mcg     No current facility-administered medications for this visit.    Allergies:   Prilocaine   Social History:  The patient  reports that he quit smoking about 60 years ago. His smoking use included cigarettes. He has a 30.00 pack-year smoking history. He has never used smokeless tobacco. He reports that he does not drink alcohol and does not use drugs.   Family History:  The patient's family history includes Diabetes in his sister; Heart disease in his mother; Hypertension in his brother, daughter, father, sister, and son; Rheum arthritis in his mother and sister; Stroke in his father.    ROS:  Please see the history of present illness.   Otherwise, review of systems is positive for none.   All other systems are reviewed and negative.   PHYSICAL EXAM: VS:  BP (!) 116/58   Pulse 86   Ht 5\' 9"  (1.753 m)   Wt 167 lb 12.8 oz (76.1 kg)   SpO2 95%   BMI 24.78 kg/m  , BMI Body mass index is 24.78 kg/m. GEN: Well nourished, well developed, in no acute distress  HEENT: normal  Neck: no JVD, carotid bruits, or masses Cardiac: RRR; no murmurs, rubs, or gallops,no edema  Respiratory:  clear to auscultation bilaterally, normal work of breathing GI: soft, nontender, nondistended, + BS MS: no deformity or atrophy  Skin: warm and dry, device site well healed Neuro:  Strength and sensation are intact Psych: euthymic mood, full affect  EKG:  EKG is not ordered today. Personal review of the ekg ordered 02/16/2020 shows atrial paced, rate 78  Personal review of the device interrogation today. Results in [place]: No results found for requested labs within last 8760 hours.     Lipid Panel     Component Value Date/Time   TRIG 98 04/24/2019 0019     Wt Readings from Last 3 Encounters:  05/10/20 167 lb 12.8 oz (76.1 kg)  02/16/20 168 lb (76.2 kg)  07/22/19 159 lb 9.6 oz (72.4 kg)      Other studies Reviewed: Additional studies/ records that were reviewed today include: TTE 06/10/17  Review of the above records today demonstrates:  - Left ventricle: The cavity size was normal. Systolic function was   normal. The estimated ejection fraction was in the range of 55%   to 60%. Wall motion was normal; there were no regional wall   motion abnormalities. Features are consistent with  a pseudonormal   left ventricular filling pattern, with concomitant abnormal   relaxation and increased filling pressure (grade 2 diastolic   dysfunction). - Aortic valve: There was mild regurgitation. Valve area (VTI):   1.72 cm^2. Valve area (Vmax): 1.54 cm^2. Valve area (Vmean): 1.74   cm^2. - Left atrium: The atrium was moderately dilated. - Right atrium: The atrium was moderately dilated. - Tricuspid valve: There was moderate regurgitation. - Pulmonary arteries: PA peak pressure: 49 mm Hg (S).   ASSESSMENT AND PLAN:  1.  Sick sinus syndrome: Status post Medtronic dual-chamber pacemaker.  Device is reached RRT.  We Shareef Eddinger thus plan for generator change.  Risks and benefits of been discussed which were bleeding, and infection.  Patient understands these risks and is agreed to the procedure.  2.  SVT: Minimally symptomatic.  PACs are potentially the cause of arrhythmia and irregularity during dialysis.  No changes.    Current medicines are reviewed at length with the patient today.   The patient does not have concerns regarding his medicines.  The following changes were made today: None  Labs/ tests ordered today include:  Orders Placed This Encounter  Procedures  . CUP PACEART INCLINIC DEVICE CHECK     Disposition:   FU with Renesmee Raine three months  Signed, Maebel Marasco  Meredith Leeds, MD  05/10/2020 1:46 PM     [place] [address] [phone] (office) [phone] (fax)

## 2020-05-10 NOTE — Progress Notes (Signed)
Electrophysiology Office Note   Date:  05/10/2020   ID:  KITO CUFFE, DOB 10-09-1925, MRN [id number]  PCP:  [name], MD  Cardiologist:  Agustin Cree Primary Electrophysiologist:  Rosi Secrist Meredith Leeds, MD    No chief complaint on file.    History of Present Illness: Timothy Carney is a 86 y.o. male who is being seen today for the evaluation of pacemaker at the request of Dough, Jaymes Graff, MD. Presenting today for electrophysiology evaluation.    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  Device check shows device at RRT.  Today, denies symptoms of palpitations, chest pain, shortness of breath, orthopnea, PND, lower extremity edema, claudication, dizziness, presyncope, syncope, bleeding, or neurologic sequela. The patient is tolerating medications without difficulties.  He is overall feeling well.  He has no chest pain or shortness of breath and is able to do all of his daily activities.  He is tolerating dialysis without issue, though he is having some bleeding from his dialysis site.   Past Medical History:  Diagnosis Date  . Anemia   . Anxiety    Takes Ativan  . Benign hypertension 01/06/2016  . Blood transfusion 2012  . Chronic kidney disease    FOLLOWED BY DR Moshe Cipro  . Chronic kidney disease, stage 5, kidney failure ([place]) 01/06/2016   managed NEPH 2016: 12 2017: dialysis  . Chronic pain syndrome 05/16/2016   2016: onset 2017: opiates  . Diverticulitis 2012  . Diverticulosis of colon 01/06/2016   2016: diverticulitis  . End stage renal disease ([place]) 07/13/2011  . GERD (gastroesophageal reflux disease)   . Gout 01/06/2016  . Hyperlipidemia, mixed 01/06/2016  . Hypertension    takes Metoprolol but pt states it makes blood pressure drop;   Marland Kitchen Mechanical complication of other vascular device, implant, and graft 09/21/2011  . Pacemaker 07/12/10   Dual  Chamber Pacemaker ( Dr. Agustin Cree at [facility])  . Pneumonia April 2012  . PSVT (paroxysmal supraventricular tachycardia) ([place]) 01/06/2016  . Secondary hyperparathyroidism ([place])   . Sick sinus syndrome ([place]) 01/06/2016   pacemaker   Past Surgical History:  Procedure Laterality Date  . AV FISTULA PLACEMENT  07/17/2011   Procedure: ARTERIOVENOUS (AV) FISTULA CREATION;  Surgeon: Elam Dutch, MD;  Location: [facility] OR;  Service: Vascular;  Laterality: Right;  . AV FISTULA PLACEMENT  10/04/2011   Procedure: ARTERIOVENOUS (AV) FISTULA CREATION;  Surgeon: Elam Dutch, MD;  Location: [place];  Service: Vascular;;  . Lillard Anes    . HERNIA REPAIR    . INSERT / REPLACE / REMOVE PACEMAKER  07/2010  . PROSTATE SURGERY       Current Outpatient Medications  Medication Sig Dispense Refill  . acetaminophen (TYLENOL) 500 MG tablet Take 500 mg by mouth every 6 (six) hours as needed for headache (pain).    Marland Kitchen ALPRAZolam (XANAX) 1 MG tablet Take 1 mg by mouth at bedtime.    Marland Kitchen aspirin EC 81 MG tablet Take 81 mg by mouth daily.    . cinacalcet (SENSIPAR) 30 MG tablet Take 30 mg by mouth at bedtime. Do not take less than 12 hours prior to dialysis  12  . diphenhydrAMINE (BENADRYL) 25 MG tablet Take 50 mg by mouth at bedtime as needed for sleep.    . metoprolol tartrate (LOPRESSOR) 25 MG tablet Take 0.5 tablets (12.5 mg total) by mouth 2 (two) times  daily. 90 tablet 3  . mometasone (ELOCON) 0.1 % cream Apply 1 application topically daily as needed (itching).    . Multiple Vitamin (MULTIVITAMIN WITH MINERALS) TABS tablet Take 1 tablet by mouth daily.    . nitroGLYCERIN (NITROSTAT) 0.4 MG SL tablet Place 0.4 mg under the tongue every 5 (five) minutes as needed for chest pain.    Marland Kitchen ondansetron (ZOFRAN) 4 MG tablet Take 1 tablet by mouth every 8 (eight) hours as needed for nausea.     . sevelamer carbonate (RENVELA) 800 MG tablet Take 1,600 mg by mouth 3 (three) times daily with meals.     . traMADol  (ULTRAM) 50 MG tablet Take 50 mg by mouth every 6 (six) hours as needed (pain).    . vitamin B-12 (CYANOCOBALAMIN) 1000 MCG tablet Take 1,000 mcg by mouth daily.    . vitamin C (ASCORBIC ACID) 500 MG tablet Take 500 mg by mouth daily.    . vitamin E 400 UNIT capsule Take 400 Units by mouth daily. 180 mcg     No current facility-administered medications for this visit.    Allergies:   Prilocaine   Social History:  The patient  reports that he quit smoking about 60 years ago. His smoking use included cigarettes. He has a 30.00 pack-year smoking history. He has never used smokeless tobacco. He reports that he does not drink alcohol and does not use drugs.   Family History:  The patient's family history includes Diabetes in his sister; Heart disease in his mother; Hypertension in his brother, daughter, father, sister, and son; Rheum arthritis in his mother and sister; Stroke in his father.    ROS:  Please see the history of present illness.   Otherwise, review of systems is positive for none.   All other systems are reviewed and negative.   PHYSICAL EXAM: VS:  BP (!) 116/58   Pulse 86   Ht 5\' 9"  (1.753 m)   Wt 167 lb 12.8 oz (76.1 kg)   SpO2 95%   BMI 24.78 kg/m  , BMI Body mass index is 24.78 kg/m. GEN: Well nourished, well developed, in no acute distress  HEENT: normal  Neck: no JVD, carotid bruits, or masses Cardiac: RRR; no murmurs, rubs, or gallops,no edema  Respiratory:  clear to auscultation bilaterally, normal work of breathing GI: soft, nontender, nondistended, + BS MS: no deformity or atrophy  Skin: warm and dry, device site well healed Neuro:  Strength and sensation are intact Psych: euthymic mood, full affect  EKG:  EKG is not ordered today. Personal review of the ekg ordered 02/16/2020 shows atrial paced, rate 78  Personal review of the device interrogation today. Results in [place]: No results found for requested labs within last 8760 hours.     Lipid Panel     Component Value Date/Time   TRIG 98 04/24/2019 0019     Wt Readings from Last 3 Encounters:  05/10/20 167 lb 12.8 oz (76.1 kg)  02/16/20 168 lb (76.2 kg)  07/22/19 159 lb 9.6 oz (72.4 kg)      Other studies Reviewed: Additional studies/ records that were reviewed today include: TTE 06/10/17  Review of the above records today demonstrates:  - Left ventricle: The cavity size was normal. Systolic function was   normal. The estimated ejection fraction was in the range of 55%   to 60%. Wall motion was normal; there were no regional wall   motion abnormalities. Features are consistent with  a pseudonormal   left ventricular filling pattern, with concomitant abnormal   relaxation and increased filling pressure (grade 2 diastolic   dysfunction). - Aortic valve: There was mild regurgitation. Valve area (VTI):   1.72 cm^2. Valve area (Vmax): 1.54 cm^2. Valve area (Vmean): 1.74   cm^2. - Left atrium: The atrium was moderately dilated. - Right atrium: The atrium was moderately dilated. - Tricuspid valve: There was moderate regurgitation. - Pulmonary arteries: PA peak pressure: 49 mm Hg (S).   ASSESSMENT AND PLAN:  1.  Sick sinus syndrome: Status post Medtronic dual-chamber pacemaker.  Device is reached RRT.  We Jhamir Pickup thus plan for generator change.  Risks and benefits of been discussed which were bleeding, and infection.  Patient understands these risks and is agreed to the procedure.  2.  SVT: Minimally symptomatic.  PACs are potentially the cause of arrhythmia and irregularity during dialysis.  No changes.    Current medicines are reviewed at length with the patient today.   The patient does not have concerns regarding his medicines.  The following changes were made today: None  Labs/ tests ordered today include:  Orders Placed This Encounter  Procedures  . CUP PACEART INCLINIC DEVICE CHECK     Disposition:   FU with Kolson Chovanec three months  Signed, Darlin Stenseth  Meredith Leeds, MD  05/10/2020 1:46 PM     [address] [address] [phone] (office) [phone] (fax)

## 2020-05-10 NOTE — Patient Instructions (Addendum)
Medication Instructions:  Your physician recommends that you continue on your current medications as directed. Please refer to the Current Medication list given to you today.  *If you need a refill on your cardiac medications before your next appointment, please call your pharmacy*   Lab Work: Pre procedure labs at dialysis next week:  BMET & CBC If you have labs (blood work) drawn today and your tests are completely normal, you will receive your results only by: Marland Kitchen MyChart Message (if you have MyChart) OR . A paper copy in the mail If you have any lab test that is abnormal or we need to change your treatment, we will call you to review the results.   Testing/Procedures: Your physician has recommended that you have a pacemaker generator change. Please see instructions below.  Follow-Up: At [facility], you and your health needs are our priority.  As part of our continuing mission to provide you with exceptional heart care, we have created designated Provider Care Teams.  These Care Teams include your primary Cardiologist (physician) and Advanced Practice Providers (APPs -  Physician Assistants and Nurse Practitioners) who all work together to provide you with the care you need, when you need it.  We recommend signing up for the patient portal called "MyChart".  Sign up information is provided on this After Visit Summary.  MyChart is used to connect with patients for Virtual Visits (Telemedicine).  Patients are able to view lab/test results, encounter notes, upcoming appointments, etc.  Non-urgent messages can be sent to your provider as well.   To learn more about what you can do with MyChart, go to [email or web address].    Your next appointment:   2 week(s) after your battery change on 06/01/2020  The format for your next appointment:   In Person  Provider:   device clinic for a wound check   Your physician recommends that you schedule a follow-up appointment in: 3 months,  after your battery change, with Dr. Curt Bears.  Thank you for choosing [facility]!!   Trinidad Curet, RN [phone]   Other Instructions   Implantable Device Instructions  You are scheduled for: Pacemaker battery change on 06/01/2020 with Dr. Curt Bears.  1.   Pre procedure testing-             A.  LAB WORK--- next week at dialysis (BMET & CBC)              B. COVID TEST-- On 05/31/2020 @ 1:00 pm - This is a Drive Up Visit at [address] [place], [place] [address].  Someone will direct you to the appropriate testing line. Stay in your car and someone will be with you shortly.   After you are tested please go home and self quarantine until the day of your procedure.    2. On the day of your procedure 06/01/2020 you will go to [facility] [phone]) at 1:30 pm.  Dennis Bast will go to the main entrance A [employer]) and enter where the [employer] are.  You will check in at ADMITTING.  You may have one support person come in to the hospital with you.  They will be asked to wait in the waiting room.   3.   Do not eat or drink after midnight prior to your procedure.   4.   On the morning of your procedure do NOT take any medication.  5.  The night before your procedure and the morning  of your procedure scrub your neck/chest with surgical scrub.  An instruction letter is included below.   5.  Plan for an overnight stay.  If you use your phone frequently bring your phone charger.  When you are discharged you will need someone to drive you home.   6.  You will follow up with the [place] clinic 10-14 days after your procedure. You will follow up with Dr. Curt Bears 91 days after your procedure.  These appointments will be made for you.   * If you have ANY questions after you get home, please call the office [phone] and ask for Edgar Corrigan RN or send a MyChart message.     [place] - Preparing For Surgery  Before surgery, you can play an important  role. Because skin is not sterile, your skin needs to be as free of germs as possible. You can reduce the number of germs on your skin by washing with CHG (chlorahexidine gluconate) Soap before surgery.  CHG is an antiseptic cleaner which kills germs and bonds with the skin to continue killing germs even after washing.   Please do not use if you have an allergy to CHG or antibacterial soaps.  If your skin becomes reddened/irritated stop using the CHG.   Do not shave (including legs and underarms) for at least 48 hours prior to first CHG shower.  It is OK to shave your face.  Please follow these instructions carefully:  1.  Shower the night before surgery and the morning of surgery with CHG.  2.  If you choose to wash your hair, wash your hair first as usual with your normal shampoo.  3.  After you shampoo, rinse your hair and body thoroughly to remove the shampoo.  4.  Use CHG as you would any other liquid soap.  You can apply CHG directly to the skin and wash gently with a clean washcloth. 5.  Apply the CHG Soap to your body ONLY FROM THE NECK DOWN.  Do not use on open wounds or open sores.  Avoid contact with your eyes, ears, mouth and genitals (private parts).  Wash genitals (private parts) with your normal soap.  6.  Wash thoroughly, paying special attention to the area where your surgery will be performed.  7.  Thoroughly rinse your body with warm water from the neck down.   8.  DO NOT shower/wash with your normal soap after using and rinsing off the CHG soap.  9.  Pat yourself dry with a clean towel.           10.  Wear clean pajamas.           11.  Place clean sheets on your bed the night of your first shower and do not sleep with pets.  Day of Surgery: Do not apply any deodorants/lotions.  Please wear clean clothes to the hospital/surgery center.

## 2020-05-31 ENCOUNTER — Other Ambulatory Visit (HOSPITAL_COMMUNITY)
Admission: RE | Admit: 2020-05-31 | Discharge: 2020-05-31 | Disposition: A | Discharge status: Home / Self Care | Payer: Medicare HMO | Source: Ambulatory Visit | Attending: Cardiology | Admitting: Cardiology

## 2020-05-31 DIAGNOSIS — Z20822 Contact with and (suspected) exposure to covid-19: Secondary | ICD-10-CM | POA: Diagnosis not present

## 2020-05-31 DIAGNOSIS — Z01812 Encounter for preprocedural laboratory examination: Secondary | ICD-10-CM | POA: Diagnosis present

## 2020-05-31 NOTE — Progress Notes (Signed)
Attempted to call patient regarding instructions for tomorrows procedure, no answer, mailbox full

## 2020-06-01 ENCOUNTER — Encounter (HOSPITAL_COMMUNITY)
Admission: RE | Disposition: A | Discharge status: Home / Self Care | Payer: Self-pay | Source: Home / Self Care | Attending: Cardiology

## 2020-06-01 ENCOUNTER — Ambulatory Visit (HOSPITAL_COMMUNITY)
Admission: RE | Admit: 2020-06-01 | Discharge: 2020-06-01 | Disposition: A | Discharge status: Home / Self Care | Payer: Medicare HMO | Attending: Cardiology | Admitting: Cardiology

## 2020-06-01 ENCOUNTER — Other Ambulatory Visit: Payer: Self-pay

## 2020-06-01 DIAGNOSIS — I120 Hypertensive chronic kidney disease with stage 5 chronic kidney disease or end stage renal disease: Secondary | ICD-10-CM | POA: Diagnosis not present

## 2020-06-01 DIAGNOSIS — Z7982 Long term (current) use of aspirin: Secondary | ICD-10-CM | POA: Insufficient documentation

## 2020-06-01 DIAGNOSIS — E782 Mixed hyperlipidemia: Secondary | ICD-10-CM | POA: Insufficient documentation

## 2020-06-01 DIAGNOSIS — Z45010 Encounter for checking and testing of cardiac pacemaker pulse generator [battery]: Secondary | ICD-10-CM | POA: Insufficient documentation

## 2020-06-01 DIAGNOSIS — Z992 Dependence on renal dialysis: Secondary | ICD-10-CM | POA: Diagnosis not present

## 2020-06-01 DIAGNOSIS — Z87891 Personal history of nicotine dependence: Secondary | ICD-10-CM | POA: Diagnosis not present

## 2020-06-01 DIAGNOSIS — Z79899 Other long term (current) drug therapy: Secondary | ICD-10-CM | POA: Insufficient documentation

## 2020-06-01 DIAGNOSIS — I471 Supraventricular tachycardia: Secondary | ICD-10-CM | POA: Insufficient documentation

## 2020-06-01 DIAGNOSIS — N185 Chronic kidney disease, stage 5: Secondary | ICD-10-CM | POA: Diagnosis not present

## 2020-06-01 DIAGNOSIS — I495 Sick sinus syndrome: Secondary | ICD-10-CM | POA: Diagnosis not present

## 2020-06-01 HISTORY — PX: PPM GENERATOR CHANGEOUT: EP1233

## 2020-06-01 LAB — SARS CORONAVIRUS 2 (TAT 6-24 HRS): SARS Coronavirus 2: NEGATIVE

## 2020-06-01 SURGERY — PPM GENERATOR CHANGEOUT
Anesthesia: LOCAL

## 2020-06-01 MED ORDER — SODIUM CHLORIDE 0.9 % IV SOLN: INTRAVENOUS | Status: DC

## 2020-06-01 MED ORDER — CHLORHEXIDINE GLUCONATE 4 % EX LIQD: 4.0000 "application " | Freq: Once | CUTANEOUS | Status: DC

## 2020-06-01 MED ORDER — ACETAMINOPHEN 325 MG PO TABS: 325.0000 mg | ORAL_TABLET | ORAL | Status: DC | PRN

## 2020-06-01 MED ORDER — CEFAZOLIN SODIUM-DEXTROSE 2-4 GM/100ML-% IV SOLN
INTRAVENOUS | Status: AC
  Filled 2020-06-01: qty 100

## 2020-06-01 MED ORDER — SODIUM CHLORIDE 0.9 % IV SOLN
80.0000 mg | INTRAVENOUS | Status: AC
  Administered 2020-06-01: 80 mg

## 2020-06-01 MED ORDER — ONDANSETRON HCL 4 MG/2ML IJ SOLN: 4.0000 mg | Freq: Four times a day (QID) | INTRAMUSCULAR | Status: DC | PRN

## 2020-06-01 MED ORDER — CEFAZOLIN SODIUM-DEXTROSE 2-4 GM/100ML-% IV SOLN
2.0000 g | INTRAVENOUS | Status: AC
  Administered 2020-06-01: 2 g via INTRAVENOUS

## 2020-06-01 MED ORDER — SODIUM CHLORIDE 0.9 % IV SOLN
INTRAVENOUS | Status: AC
  Filled 2020-06-01: qty 2

## 2020-06-01 SURGICAL SUPPLY — 5 items
CABLE SURGICAL S-101-97-12 (CABLE) ×2 IMPLANT
IPG PACE AZUR XT DR MRI W1DR01 (Pacemaker) ×1 IMPLANT
PACE AZURE XT DR MRI W1DR01 (Pacemaker) ×2 IMPLANT
PAD PRO RADIOLUCENT 2001M-C (PAD) ×2 IMPLANT
TRAY PACEMAKER INSERTION (PACKS) ×2 IMPLANT

## 2020-06-01 NOTE — Discharge Instructions (Signed)
Implantable Cardiac Device Battery Change, Care After Remove outer dressing in 24 hours Keep dry till wound check appointment   This sheet gives you information about how to care for yourself after your procedure. Your health care provider may also give you more specific instructions. If you have problems or questions, contact your health care provider. What can I expect after the procedure? After your procedure, it is common to have:  Pain or soreness at the site where the cardiac device was inserted.  Swelling at the site where the cardiac device was inserted.  You should received an information card for your new device in 4-8 weeks. Follow these instructions at home: Incision care   Keep the incision clean and dry. ? Do not take baths, swim, or use a hot tub until after your wound check.  ? Do not shower for at least 7 days, or as directed by your health care provider. ? Pat the area dry with a clean towel. Do not rub the area. This may cause bleeding.  Follow instructions from your health care provider about how to take care of your incision. Make sure you: ? Leave stitches (sutures), skin glue, or adhesive strips in place. These skin closures may need to stay in place for 2 weeks or longer. If adhesive strip edges start to loosen and curl up, you may trim the loose edges. Do not remove adhesive strips completely unless your health care provider tells you to do that.  Check your incision area every day for signs of infection. Check for: ? More redness, swelling, or pain. ? More fluid or blood. ? Warmth. ? Pus or a bad smell. Activity  Do not lift anything that is heavier than 10 lb (4.5 kg) until your health care provider says it is okay to do so.  For the first week, or as long as told by your health care provider: ? Avoid lifting your affected arm higher than your shoulder. ? After 1 week, Be gentle when you move your arms over your head. It is okay to raise your arm to comb  your hair. ? Avoid strenuous exercise.  Ask your health care provider when it is okay to: ? Resume your normal activities. ? Return to work or school. ? Resume sexual activity. Eating and drinking  Eat a heart-healthy diet. This should include plenty of fresh fruits and vegetables, whole grains, low-fat dairy products, and lean protein like chicken and fish.  Limit alcohol intake to no more than 1 drink a day for non-pregnant women and 2 drinks a day for men. One drink equals 12 oz of beer, 5 oz of wine, or 1 oz of hard liquor.  Check ingredients and nutrition facts on packaged foods and beverages. Avoid the following types of food: ? Food that is high in salt (sodium). ? Food that is high in saturated fat, like full-fat dairy or red meat. ? Food that is high in trans fat, like fried food. ? Food and drinks that are high in sugar. Lifestyle  Do not use any products that contain nicotine or tobacco, such as cigarettes and e-cigarettes. If you need help quitting, ask your health care provider.  Take steps to manage and control your weight.  Once cleared, get regular exercise. Aim for 150 minutes of moderate-intensity exercise (such as walking or yoga) or 75 minutes of vigorous exercise (such as running or swimming) each week.  Manage other health problems, such as diabetes or high blood pressure. Ask your  health care provider how you can manage these conditions. General instructions  Do not drive for 24 hours after your procedure if you were given a medicine to help you relax (sedative).  Take over-the-counter and prescription medicines only as told by your health care provider.  Avoid putting pressure on the area where the cardiac device was placed.  If you need an MRI after your cardiac device has been placed, be sure to tell the health care provider who orders the MRI that you have a cardiac device.  Avoid close and prolonged exposure to electrical devices that have strong  magnetic fields. These include: ? Cell phones. Avoid keeping them in a pocket near the cardiac device, and try using the ear opposite the cardiac device. ? MP3 players. ? Household appliances, like microwaves. ? Metal detectors. ? Electric generators. ? High-tension wires.  Keep all follow-up visits as directed by your health care provider. This is important. Contact a health care provider if:  You have pain at the incision site that is not relieved by over-the-counter or prescription medicines.  You have any of these around your incision site or coming from it: ? More redness, swelling, or pain. ? Fluid or blood. ? Warmth to the touch. ? Pus or a bad smell.  You have a fever.  You feel brief, occasional palpitations, light-headedness, or any symptoms that you think might be related to your heart. Get help right away if:  You experience chest pain that is different from the pain at the cardiac device site.  You develop a red streak that extends above or below the incision site.  You experience shortness of breath.  You have palpitations or an irregular heartbeat.  You have light-headedness that does not go away quickly.  You faint or have dizzy spells.  Your pulse suddenly drops or increases rapidly and does not return to normal.  You begin to gain weight and your legs and ankles swell. Summary  After your procedure, it is common to have pain, soreness, and some swelling where the cardiac device was inserted.  Make sure to keep your incision clean and dry. Follow instructions from your health care provider about how to take care of your incision.  Check your incision every day for signs of infection, such as more pain or swelling, pus or a bad smell, warmth, or leaking fluid and blood.  Avoid strenuous exercise and lifting your left arm higher than your shoulder for 2 weeks, or as long as told by your health care provider. This information is not intended to replace  advice given to you by your health care provider. Make sure you discuss any questions you have with your health care provider.

## 2020-06-01 NOTE — Interval H&P Note (Signed)
History and Physical Interval Note:  06/01/2020 1:49 PM  Timothy Carney  has presented today for surgery, with the diagnosis of eol-sick sinus.  The various methods of treatment have been discussed with the patient and family. After consideration of risks, benefits and other options for treatment, the patient has consented to  Procedure(s): PPM GENERATOR CHANGEOUT (N/A) as a surgical intervention.  The patient's history has been reviewed, patient examined, no change in status, stable for surgery.  I have reviewed the patient's chart and labs.  Questions were answered to the patient's satisfaction.     Megen Madewell [employer]

## 2020-06-02 ENCOUNTER — Encounter (HOSPITAL_COMMUNITY): Payer: Self-pay | Admitting: Cardiology

## 2020-06-15 ENCOUNTER — Ambulatory Visit: Payer: Medicare HMO

## 2020-07-11 NOTE — Progress Notes (Deleted)
Cardiology Office Note Date:  07/11/2020  Patient ID:  Timothy Carney, Timothy Carney [birth year], MRN [id number] PCP:  [name], MD  Cardiologist:  Dr. Agustin Cree Electrophysiologist: Dr. Curt Bears Nephrologist: ***  ***refresh   Chief Complaint: *** wound check  History of Present Illness: Timothy Carney is a 87 y.o. male with history of ESRF on HD, HTN, chronic pain sx, HLD, SVT, SSSx w/PPM.  *** site *** SVT?  H/o NSVT *** symptoms *** nephrologist?    Device information MDT dual chamber PPM implanted 07/12/2010, gen change 06/01/20   Past Medical History:  Diagnosis Date  . Anemia   . Anxiety    Takes Ativan  . Benign hypertension 01/06/2016  . Blood transfusion 2012  . Chronic kidney disease    FOLLOWED BY DR Moshe Cipro  . Chronic kidney disease, stage 5, kidney failure ([place]) 01/06/2016   managed NEPH 2016: 12 2017: dialysis  . Chronic pain syndrome 05/16/2016   2016: onset 2017: opiates  . Diverticulitis 2012  . Diverticulosis of colon 01/06/2016   2016: diverticulitis  . End stage renal disease ([place]) 07/13/2011  . GERD (gastroesophageal reflux disease)   . Gout 01/06/2016  . Hyperlipidemia, mixed 01/06/2016  . Hypertension    takes Metoprolol but pt states it makes blood pressure drop;   Marland Kitchen Mechanical complication of other vascular device, implant, and graft 09/21/2011  . Pacemaker 07/12/10   Dual Chamber Pacemaker ( Dr. Agustin Cree at [facility])  . Pneumonia April 2012  . PSVT (paroxysmal supraventricular tachycardia) ([place]) 01/06/2016  . Secondary hyperparathyroidism ([place])   . Sick sinus syndrome ([place]) 01/06/2016   pacemaker    Past Surgical History:  Procedure Laterality Date  . AV FISTULA PLACEMENT  07/17/2011   Procedure: ARTERIOVENOUS (AV) FISTULA CREATION;  Surgeon: Elam Dutch, MD;  Location: [facility] OR;  Service: Vascular;  Laterality: Right;  . AV FISTULA PLACEMENT  10/04/2011   Procedure: ARTERIOVENOUS (AV) FISTULA CREATION;  Surgeon: Elam Dutch,  MD;  Location: [place];  Service: Vascular;;  . Lillard Anes    . HERNIA REPAIR    . INSERT / REPLACE / REMOVE PACEMAKER  07/2010  . PPM GENERATOR CHANGEOUT N/A 06/01/2020   Procedure: PPM GENERATOR CHANGEOUT;  Surgeon: Constance Haw, MD;  Location: [place] CV LAB;  Service: Cardiovascular;  Laterality: N/A;  . PROSTATE SURGERY      Current Outpatient Medications  Medication Sig Dispense Refill  . acetaminophen (TYLENOL) 500 MG tablet Take 500 mg by mouth every 6 (six) hours as needed for headache (pain).    Marland Kitchen ALPRAZolam (XANAX) 1 MG tablet Take 1 mg by mouth at bedtime.    Marland Kitchen aspirin EC 81 MG tablet Take 81 mg by mouth daily.    . cinacalcet (SENSIPAR) 30 MG tablet Take 30 mg by mouth at bedtime. Do not take less than 12 hours prior to dialysis  12  . diphenhydrAMINE (BENADRYL) 25 MG tablet Take 50 mg by mouth at bedtime as needed for sleep.    . metoprolol tartrate (LOPRESSOR) 25 MG tablet Take 0.5 tablets (12.5 mg total) by mouth 2 (two) times daily. 90 tablet 3  . mometasone (ELOCON) 0.1 % cream Apply 1 application topically daily as needed (itching).    . Multiple Vitamin (MULTIVITAMIN WITH MINERALS) TABS tablet Take 1 tablet by mouth daily.    . nitroGLYCERIN (NITROSTAT) 0.4 MG SL tablet Place 0.4 mg under the tongue every 5 (five) minutes as needed for chest pain.    Marland Kitchen  ondansetron (ZOFRAN) 4 MG tablet Take 1 tablet by mouth every 8 (eight) hours as needed for nausea.     . sevelamer carbonate (RENVELA) 800 MG tablet Take 1,600 mg by mouth 3 (three) times daily with meals.     . traMADol (ULTRAM) 50 MG tablet Take 50 mg by mouth every 6 (six) hours as needed (pain).    . vitamin B-12 (CYANOCOBALAMIN) 1000 MCG tablet Take 1,000 mcg by mouth daily.    . vitamin C (ASCORBIC ACID) 500 MG tablet Take 500 mg by mouth daily.    . vitamin E 400 UNIT capsule Take 400 Units by mouth daily. 180 mcg     No current facility-administered medications for this visit.    Allergies:    Prilocaine   Social History:  The patient  reports that he quit smoking about 60 years ago. His smoking use included cigarettes. He has a 30.00 pack-year smoking history. He has never used smokeless tobacco. He reports that he does not drink alcohol and does not use drugs.   Family History:  The patient's family history includes Diabetes in his sister; Heart disease in his mother; Hypertension in his brother, daughter, father, sister, and son; Rheum arthritis in his mother and sister; Stroke in his father.  ROS:  Please see the history of present illness.    All other systems are reviewed and otherwise negative.   PHYSICAL EXAM:  VS:  There were no vitals taken for this visit. BMI: There is no height or weight on file to calculate BMI. Well nourished, well developed, in no acute distress HEENT: normocephalic, atraumatic Neck: no JVD, carotid bruits or masses Cardiac:  *** RRR; no significant murmurs, no rubs, or gallops Lungs:  *** CTA b/l, no wheezing, rhonchi or rales Abd: soft, nontender MS: no deformity or *** atrophy Ext: *** no edema Skin: warm and dry, no rash Neuro:  No gross deficits appreciated Psych: euthymic mood, full affect  *** PPM site is stable, no tethering or discomfort   EKG:  Not done today  Device interrogation done today and reviewed by myself:  ***   06/10/2018; TTE Study Conclusions  - Left ventricle: The cavity size was normal. Systolic function was  normal. The estimated ejection fraction was in the range of 55%  to 60%. Wall motion was normal; there were no regional wall  motion abnormalities. Features are consistent with a pseudonormal  left ventricular filling pattern, with concomitant abnormal  relaxation and increased filling pressure (grade 2 diastolic  dysfunction).  - Aortic valve: There was mild regurgitation. Valve area (VTI):  1.72 cm^2. Valve area (Vmax): 1.54 cm^2. Valve area (Vmean): 1.74  cm^2.  - Left atrium: The  atrium was moderately dilated.  - Right atrium: The atrium was moderately dilated.  - Tricuspid valve: There was moderate regurgitation.  - Pulmonary arteries: PA peak pressure: 49 mm Hg (S).   Impressions:  - Normal LVEF.  Biatrial enlargement.  Pseudonormal transmitral flow.  Moderate TR.    Recent Labs: No results found for requested labs within last 8760 hours.  No results found for requested labs within last 8760 hours.   CrCl cannot be calculated (Patient's most recent lab result is older than the maximum 21 days allowed.).   Wt Readings from Last 3 Encounters:  06/01/20 160 lb (72.6 kg)  05/10/20 167 lb 12.8 oz (76.1 kg)  02/16/20 168 lb (76.2 kg)     Other studies reviewed: Additional studies/records reviewed today include: summarized above  ASSESSMENT AND PLAN:  1. PPM     ***   2. HTN     ***  3. SVT 4. H/o NSVT     ***  Disposition: F/u with ***  Current medicines are reviewed at length with the patient today.  The patient did not have any concerns regarding medicines.  Venetia Night, PA-C 07/11/2020 10:41 AM     [facility] [address] [place] [address] [phone] (office)  [phone] (fax)

## 2020-07-13 ENCOUNTER — Encounter: Payer: Medicare HMO | Admitting: Physician Assistant

## 2020-07-19 ENCOUNTER — Encounter: Payer: Medicare HMO | Admitting: Nurse Practitioner

## 2020-07-19 NOTE — Progress Notes (Deleted)
Wound check s/p MDT PPM gen change 06/01/20 Normal device function, wound well healed Follow up Dr Curt Bears as scheduled  Chanetta Marshall, NP 07/19/2020 11:01 AM

## 2020-09-01 ENCOUNTER — Ambulatory Visit (INDEPENDENT_AMBULATORY_CARE_PROVIDER_SITE_OTHER): Payer: Medicare HMO

## 2020-09-01 DIAGNOSIS — I495 Sick sinus syndrome: Secondary | ICD-10-CM

## 2020-09-06 ENCOUNTER — Ambulatory Visit (INDEPENDENT_AMBULATORY_CARE_PROVIDER_SITE_OTHER): Payer: Medicare HMO | Admitting: Cardiology

## 2020-09-06 ENCOUNTER — Other Ambulatory Visit: Payer: Self-pay

## 2020-09-06 ENCOUNTER — Encounter: Payer: Self-pay | Admitting: Cardiology

## 2020-09-06 VITALS — BP 130/62 | HR 65 | Ht 69.0 in | Wt 159.0 lb

## 2020-09-06 DIAGNOSIS — R0600 Dyspnea, unspecified: Secondary | ICD-10-CM

## 2020-09-06 DIAGNOSIS — I495 Sick sinus syndrome: Secondary | ICD-10-CM | POA: Diagnosis not present

## 2020-09-06 LAB — CUP PACEART REMOTE DEVICE CHECK
Battery Remaining Longevity: 158 mo
Battery Voltage: 3.2 V
Brady Statistic AP VP Percent: 0.3 %
Brady Statistic AP VS Percent: 43.32 %
Brady Statistic AS VP Percent: 0.05 %
Brady Statistic AS VS Percent: 56.34 %
Brady Statistic RA Percent Paced: 48.51 %
Brady Statistic RV Percent Paced: 0.39 %
Date Time Interrogation Session: 20220401140934
Implantable Lead Implant Date: 20120207
Implantable Lead Implant Date: 20120207
Implantable Lead Location: 753859
Implantable Lead Location: 753860
Implantable Pulse Generator Implant Date: 20211228
Lead Channel Impedance Value: 285 Ohm
Lead Channel Impedance Value: 342 Ohm
Lead Channel Impedance Value: 361 Ohm
Lead Channel Impedance Value: 418 Ohm
Lead Channel Pacing Threshold Amplitude: 0.5 V
Lead Channel Pacing Threshold Amplitude: 0.875 V
Lead Channel Pacing Threshold Pulse Width: 0.4 ms
Lead Channel Pacing Threshold Pulse Width: 0.4 ms
Lead Channel Sensing Intrinsic Amplitude: 1.625 mV
Lead Channel Sensing Intrinsic Amplitude: 1.625 mV
Lead Channel Sensing Intrinsic Amplitude: 12.375 mV
Lead Channel Sensing Intrinsic Amplitude: 12.375 mV
Lead Channel Setting Pacing Amplitude: 3.5 V
Lead Channel Setting Pacing Amplitude: 3.5 V
Lead Channel Setting Pacing Pulse Width: 0.4 ms
Lead Channel Setting Sensing Sensitivity: 1.2 mV

## 2020-09-06 MED ORDER — METOPROLOL SUCCINATE ER 50 MG PO TB24: 50.0000 mg | ORAL_TABLET | Freq: Every day | ORAL | 10 refills | Status: DC

## 2020-09-06 NOTE — Patient Instructions (Addendum)
Medication Instructions:  Your physician has recommended you make the following change in your medication:  1. STOP Metoprolol Tartrate (Lopressor) 2. START Metoprolol Succinate (Toprol) 50 mg once daily  *If you need a refill on your cardiac medications before your next appointment, please call your pharmacy*   Lab Work: None ordered   Testing/Procedures: Your physician has requested that you have an echocardiogram. Echocardiography is a painless test that uses sound waves to create images of your heart. It provides your doctor with information about the size and shape of your heart and how well your heart's chambers and valves are working. This procedure takes approximately one hour. There are no restrictions for this procedure.   Follow-Up: At [facility], you and your health needs are our priority.  As part of our continuing mission to provide you with exceptional heart care, we have created designated Provider Care Teams.  These Care Teams include your primary Cardiologist (physician) and Advanced Practice Providers (APPs -  Physician Assistants and Nurse Practitioners) who all work together to provide you with the care you need, when you need it.  We recommend signing up for the patient portal called "MyChart".  Sign up information is provided on this After Visit Summary.  MyChart is used to connect with patients for Virtual Visits (Telemedicine).  Patients are able to view lab/test results, encounter notes, upcoming appointments, etc.  Non-urgent messages can be sent to your provider as well.   To learn more about what you can do with MyChart, go to [email or web address].    Remote monitoring is used to monitor your Pacemaker or ICD from home. This monitoring reduces the number of office visits required to check your device to one time per year. It allows [place] to keep an eye on the functioning of your device to ensure it is working properly. You are scheduled for a device check from  home on 12/01/20. You may send your transmission at any time that day. If you have a wireless device, the transmission will be sent automatically. After your physician reviews your transmission, you will receive a postcard with your next transmission date.  Your next appointment:    December  The format for your next appointment:   In Person  Provider:   Allegra Lai, MD   Thank you for choosing [facility]!!   Trinidad Curet, RN [phone]

## 2020-09-06 NOTE — Progress Notes (Signed)
Electrophysiology Office Note   Date:  09/06/2020   ID:  Timothy Carney, DOB 20-Apr-1926, MRN [id number]  PCP:  [name], MD  Cardiologist:  Agustin Cree Primary Electrophysiologist:  Timothy Hurtado Meredith Leeds, MD    No chief complaint on file.    History of Present Illness: Timothy Carney is a 87 y.o. male who is being seen today for the evaluation of pacemaker at the request of Dough, Jaymes Graff, MD. Presenting today for electrophysiology evaluation.    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  He is now status post generator change 06/01/2020.  Today, denies symptoms of palpitations, chest pain,  orthopnea, lower extremity edema, claudication, dizziness, presyncope, syncope, bleeding, or neurologic sequela. The patient is tolerating medications without difficulties.  He has intermittent episodes of fatigue.  This occurs multiple times a day and can be short-lived or last for up to half the day.  On device interrogation, he has had multiple episodes of SVT that could be correlated to his episodes of fatigue.  He also states that he gets short of breath when he walks on flat ground and particularly up hills.  He also wakes up in the middle the night feeling short of breath and has to go to a chair to catch his breath.   Past Medical History:  Diagnosis Date  . Anemia   . Anxiety    Takes Ativan  . Benign hypertension 01/06/2016  . Blood transfusion 2012  . Chronic kidney disease    FOLLOWED BY DR Moshe Cipro  . Chronic kidney disease, stage 5, kidney failure ([place]) 01/06/2016   managed NEPH 2016: 12 2017: dialysis  . Chronic pain syndrome 05/16/2016   2016: onset 2017: opiates  . Diverticulitis 2012  . Diverticulosis of colon 01/06/2016   2016: diverticulitis  . End stage renal disease ([place]) 07/13/2011  . GERD (gastroesophageal reflux disease)   . Gout 01/06/2016  .  Hyperlipidemia, mixed 01/06/2016  . Hypertension    takes Metoprolol but pt states it makes blood pressure drop;   Marland Kitchen Mechanical complication of other vascular device, implant, and graft 09/21/2011  . Pacemaker 07/12/10   Dual Chamber Pacemaker ( Dr. Agustin Cree at [facility])  . Pneumonia April 2012  . PSVT (paroxysmal supraventricular tachycardia) ([place]) 01/06/2016  . Secondary hyperparathyroidism ([place])   . Sick sinus syndrome ([place]) 01/06/2016   pacemaker   Past Surgical History:  Procedure Laterality Date  . AV FISTULA PLACEMENT  07/17/2011   Procedure: ARTERIOVENOUS (AV) FISTULA CREATION;  Surgeon: Elam Dutch, MD;  Location: [facility] OR;  Service: Vascular;  Laterality: Right;  . AV FISTULA PLACEMENT  10/04/2011   Procedure: ARTERIOVENOUS (AV) FISTULA CREATION;  Surgeon: Elam Dutch, MD;  Location: [place];  Service: Vascular;;  . Timothy Carney    . HERNIA REPAIR    . INSERT / REPLACE / REMOVE PACEMAKER  07/2010  . PPM GENERATOR CHANGEOUT N/A 06/01/2020   Procedure: PPM GENERATOR CHANGEOUT;  Surgeon: Constance Haw, MD;  Location: [place] CV LAB;  Service: Cardiovascular;  Laterality: N/A;  . PROSTATE SURGERY       Current Outpatient Medications  Medication Sig Dispense Refill  . acetaminophen (TYLENOL) 500 MG tablet Take 500 mg by mouth every 6 (six) hours as needed for headache (pain).    Marland Kitchen ALPRAZolam (XANAX) 1 MG tablet Take 1 mg by mouth at bedtime.    Marland Kitchen aspirin  EC 81 MG tablet Take 81 mg by mouth daily.    . cinacalcet (SENSIPAR) 30 MG tablet Take 30 mg by mouth at bedtime. Do not take less than 12 hours prior to dialysis  12  . diphenhydrAMINE (BENADRYL) 25 MG tablet Take 50 mg by mouth at bedtime as needed for sleep.    . metoprolol succinate (TOPROL-XL) 50 MG 24 hr tablet Take 1 tablet (50 mg total) by mouth daily. Take with or immediately following a meal. 30 tablet 10  . mometasone (ELOCON) 0.1 % cream Apply 1 application topically daily as needed (itching).    .  Multiple Vitamin (MULTIVITAMIN WITH MINERALS) TABS tablet Take 1 tablet by mouth daily.    . nitroGLYCERIN (NITROSTAT) 0.4 MG SL tablet Place 0.4 mg under the tongue every 5 (five) minutes as needed for chest pain.    Marland Kitchen ondansetron (ZOFRAN) 4 MG tablet Take 1 tablet by mouth every 8 (eight) hours as needed for nausea.     . sevelamer carbonate (RENVELA) 800 MG tablet Take 1,600 mg by mouth 3 (three) times daily with meals.     . tamsulosin (FLOMAX) 0.4 MG CAPS capsule Take 0.8 mg by mouth daily.    . traMADol (ULTRAM) 50 MG tablet Take 50 mg by mouth every 6 (six) hours as needed (pain).    . vitamin B-12 (CYANOCOBALAMIN) 1000 MCG tablet Take 1,000 mcg by mouth daily.    . vitamin C (ASCORBIC ACID) 500 MG tablet Take 500 mg by mouth daily.    . vitamin E 400 UNIT capsule Take 400 Units by mouth daily. 180 mcg     No current facility-administered medications for this visit.    Allergies:   Prilocaine   Social History:  The patient  reports that he quit smoking about 60 years ago. His smoking use included cigarettes. He has a 30.00 pack-year smoking history. He has never used smokeless tobacco. He reports that he does not drink alcohol and does not use drugs.   Family History:  The patient's family history includes Diabetes in his sister; Heart disease in his mother; Hypertension in his brother, daughter, father, sister, and son; Rheum arthritis in his mother and sister; Stroke in his father.   ROS:  Please see the history of present illness.   Otherwise, review of systems is positive for none.   All other systems are reviewed and negative.   PHYSICAL EXAM: VS:  BP 130/62   Pulse 65   Ht '5\' 9"'$  (1.753 m)   Wt 159 lb (72.1 kg)   SpO2 96%   BMI 23.48 kg/m  , BMI Body mass index is 23.48 kg/m. GEN: Well nourished, well developed, in no acute distress  HEENT: normal  Neck: no JVD, carotid bruits, or masses Cardiac: RRR; no murmurs, rubs, or gallops,no edema  Respiratory:  clear to  auscultation bilaterally, normal work of breathing GI: soft, nontender, nondistended, + BS MS: no deformity or atrophy  Skin: warm and dry, device site well healed Neuro:  Strength and sensation are intact Psych: euthymic mood, full affect  EKG:  EKG is ordered today. Personal review of the ekg ordered shows atrial paced, PVCs  Personal review of the device interrogation today. Results in [place]: No results found for requested labs within last 8760 hours.    Lipid Panel     Component Value Date/Time   TRIG 98 04/24/2019 0019     Wt Readings from Last 3 Encounters:  09/06/20  159 lb (72.1 kg)  06/01/20 160 lb (72.6 kg)  05/10/20 167 lb 12.8 oz (76.1 kg)      Other studies Reviewed: Additional studies/ records that were reviewed today include: TTE 06/10/17  Review of the above records today demonstrates:  - Left ventricle: The cavity size was normal. Systolic function was   normal. The estimated ejection fraction was in the range of 55%   to 60%. Wall motion was normal; there were no regional wall   motion abnormalities. Features are consistent with a pseudonormal   left ventricular filling pattern, with concomitant abnormal   relaxation and increased filling pressure (grade 2 diastolic   dysfunction). - Aortic valve: There was mild regurgitation. Valve area (VTI):   1.72 cm^2. Valve area (Vmax): 1.54 cm^2. Valve area (Vmean): 1.74   cm^2. - Left atrium: The atrium was moderately dilated. - Right atrium: The atrium was moderately dilated. - Tricuspid valve: There was moderate regurgitation. - Pulmonary arteries: PA peak pressure: 49 mm Hg (S).   ASSESSMENT AND PLAN:  1.  Sick sinus syndrome: Status post Medtronic dual-chamber pacemaker.  Device functioning appropriately.  Recent generator change on 06/01/2020.  No changes.    2.  SVT: Has had more frequent episodes of SVT.  This may be the cause of his overall fatigue.  Due to that, we Mel Tadros stop his  metoprolol and start Toprol-XL 50 mg.  3.  Dyspnea, orthopnea: All symptoms that could be secondary to heart failure.  He had an echo in 2020 that showed a normal ejection fraction.  With his current symptoms, we Phoenyx Paulsen plan for a repeat echo.   Current medicines are reviewed at length with the patient today.   The patient does not have concerns regarding his medicines.  The following changes were made today: Stop metoprolol, start Toprol-XL  Labs/ tests ordered today include:  Orders Placed This Encounter  Procedures  . EKG 12-Lead  . ECHOCARDIOGRAM COMPLETE     Disposition:   FU with [place] Skalicky 12 months  Signed, Timothy Marti Meredith Leeds, MD  09/06/2020 2:49 PM     [address] [place] [address] [phone] (office) [phone] (fax)

## 2020-09-14 NOTE — Progress Notes (Signed)
Remote pacemaker transmission.   

## 2020-09-30 ENCOUNTER — Other Ambulatory Visit: Payer: Medicare HMO

## 2020-12-01 ENCOUNTER — Ambulatory Visit: Payer: Medicare HMO

## 2021-03-19 IMAGING — DX DG CHEST 1V PORT
1 series · 1 of 1 positions shown · non-contrast
Comparison: 11/26/2015

CLINICAL DATA: Shortness of breath, fever

EXAM:
PORTABLE CHEST 1 VIEW

[chest ap]
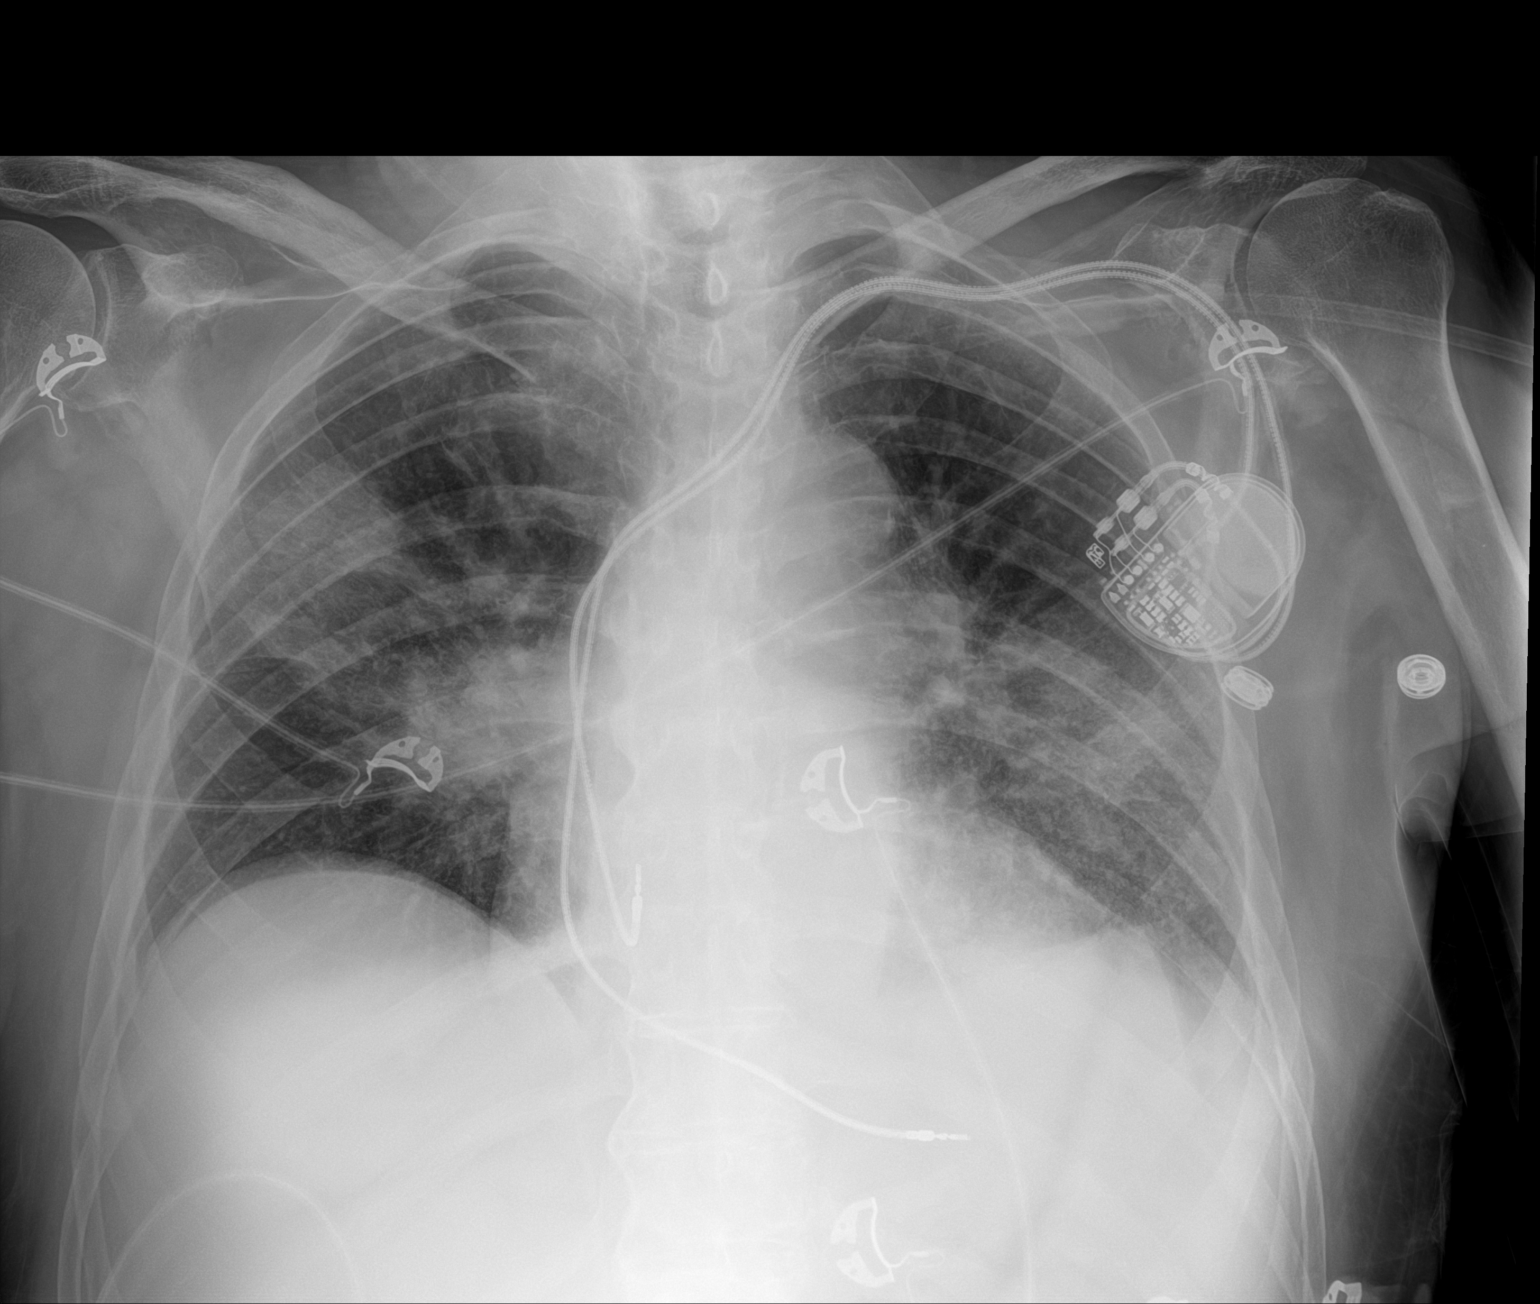

[1 of 1 positions shown; findings below may reference images not displayed]

FINDINGS: Left pacer remains in place, unchanged. Low lung volumes. Airspace
disease noted in the right upper lobe, left upper lobe and right
perihilar region. This likely reflects pneumonia. Heart is
borderline in size. No acute bony abnormality.
IMPRESSION: Bilateral airspace opacities most compatible with multifocal
pneumonia. Followup PA and lateral chest X-ray is recommended in 3-4
weeks following trial of antibiotic therapy to ensure resolution and
exclude underlying malignancy.

## 2021-03-29 LAB — CUP PACEART REMOTE DEVICE CHECK
Battery Remaining Longevity: 167 mo
Battery Voltage: 3.18 V
Brady Statistic AP VP Percent: 0.22 %
Brady Statistic AP VS Percent: 59.76 %
Brady Statistic AS VP Percent: 0.03 %
Brady Statistic AS VS Percent: 39.98 %
Brady Statistic RA Percent Paced: 64.51 %
Brady Statistic RV Percent Paced: 0.25 %
Date Time Interrogation Session: 20220630121025
Implantable Lead Implant Date: 20120207
Implantable Lead Implant Date: 20120207
Implantable Lead Location: 753859
Implantable Lead Location: 753860
Implantable Pulse Generator Implant Date: 20211228
Lead Channel Impedance Value: 304 Ohm
Lead Channel Impedance Value: 361 Ohm
Lead Channel Impedance Value: 380 Ohm
Lead Channel Impedance Value: 456 Ohm
Lead Channel Pacing Threshold Amplitude: 0.5 V
Lead Channel Pacing Threshold Amplitude: 0.875 V
Lead Channel Pacing Threshold Pulse Width: 0.4 ms
Lead Channel Pacing Threshold Pulse Width: 0.4 ms
Lead Channel Sensing Intrinsic Amplitude: 13.125 mV
Lead Channel Sensing Intrinsic Amplitude: 13.125 mV
Lead Channel Sensing Intrinsic Amplitude: 2 mV
Lead Channel Sensing Intrinsic Amplitude: 2 mV
Lead Channel Setting Pacing Amplitude: 1.5 V
Lead Channel Setting Pacing Amplitude: 2 V
Lead Channel Setting Pacing Pulse Width: 0.4 ms
Lead Channel Setting Sensing Sensitivity: 1.2 mV

## 2021-09-05 ENCOUNTER — Encounter: Payer: Medicare HMO | Admitting: Cardiology

## 2021-10-10 ENCOUNTER — Encounter: Payer: Medicare HMO | Admitting: Cardiology

## 2021-10-10 NOTE — Progress Notes (Deleted)
Electrophysiology Office Note   Date:  10/10/2021   ID:  Timothy Carney, DOB [birth year], MRN [id number]  PCP:  [name], MD  Cardiologist:  Agustin Cree Primary Electrophysiologist:  Eloise Mula Meredith Leeds, MD    No chief complaint on file.    History of Present Illness: Timothy Carney is a 88 y.o. male who is being seen today for the evaluation of pacemaker at the request of Dough, Jaymes Graff, MD. Presenting today for electrophysiology evaluation.    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  He is status post Medtronic dual-chamber pacemaker.  He is status post generator change 06/01/2020. Today, denies symptoms of palpitations, chest pain, shortness of breath, orthopnea, PND, lower extremity edema, claudication, dizziness, presyncope, syncope, bleeding, or neurologic sequela. The patient is tolerating medications without difficulties. ***    Past Medical History:  Diagnosis Date   Anemia    Anxiety    Takes Ativan   Benign hypertension 01/06/2016   Blood transfusion 2012   Chronic kidney disease    FOLLOWED BY DR Moshe Cipro   Chronic kidney disease, stage 5, kidney failure ([place]) 01/06/2016   managed NEPH 2016: 12 2017: dialysis   Chronic pain syndrome 05/16/2016   2016: onset 2017: opiates   Diverticulitis 2012   Diverticulosis of colon 01/06/2016   2016: diverticulitis   End stage renal disease ([place]) 07/13/2011   GERD (gastroesophageal reflux disease)    Gout 01/06/2016   Hyperlipidemia, mixed 01/06/2016   Hypertension    takes Metoprolol but pt states it makes blood pressure drop;    Mechanical complication of other vascular device, implant, and graft 09/21/2011   Pacemaker 07/12/10   Dual Chamber Pacemaker ( Dr. Agustin Cree at [facility])   Pneumonia April 2012   PSVT (paroxysmal supraventricular tachycardia) ([place]) 01/06/2016   Secondary hyperparathyroidism ([place])    Sick sinus syndrome ([place]) 01/06/2016    pacemaker   Past Surgical History:  Procedure Laterality Date   AV FISTULA PLACEMENT  07/17/2011   Procedure: ARTERIOVENOUS (AV) FISTULA CREATION;  Surgeon: Elam Dutch, MD;  Location: [place];  Service: Vascular;  Laterality: Right;   AV FISTULA PLACEMENT  10/04/2011   Procedure: ARTERIOVENOUS (AV) FISTULA CREATION;  Surgeon: Elam Dutch, MD;  Location: [place];  Service: Vascular;;   BUNIONECTOMY     HERNIA REPAIR     INSERT / REPLACE / REMOVE PACEMAKER  07/2010   PPM GENERATOR CHANGEOUT N/A 06/01/2020   Procedure: PPM GENERATOR CHANGEOUT;  Surgeon: Constance Haw, MD;  Location: [place] [facility];  Service: Cardiovascular;  Laterality: N/A;   PROSTATE SURGERY       Current Outpatient Medications  Medication Sig Dispense Refill   acetaminophen (TYLENOL) 500 MG tablet Take 500 mg by mouth every 6 (six) hours as needed for headache (pain).     ALPRAZolam (XANAX) 1 MG tablet Take 1 mg by mouth at bedtime.     aspirin EC 81 MG tablet Take 81 mg by mouth daily.     cinacalcet (SENSIPAR) 30 MG tablet Take 30 mg by mouth at bedtime. Do not take less than 12 hours prior to dialysis  12   diphenhydrAMINE (BENADRYL) 25 MG tablet Take 50 mg by mouth at bedtime as needed for sleep.     metoprolol succinate (TOPROL-XL) 50 MG 24 hr tablet Take 1 tablet (50 mg total) by mouth daily. Take with or immediately following a meal. 30  tablet 10   mometasone (ELOCON) 0.1 % cream Apply 1 application topically daily as needed (itching).     Multiple Vitamin (MULTIVITAMIN WITH MINERALS) TABS tablet Take 1 tablet by mouth daily.     nitroGLYCERIN (NITROSTAT) 0.4 MG SL tablet Place 0.4 mg under the tongue every 5 (five) minutes as needed for chest pain.     ondansetron (ZOFRAN) 4 MG tablet Take 1 tablet by mouth every 8 (eight) hours as needed for nausea.      sevelamer carbonate (RENVELA) 800 MG tablet Take 1,600 mg by mouth 3 (three) times daily with meals.      tamsulosin (FLOMAX) 0.4 MG CAPS capsule  Take 0.8 mg by mouth daily.     traMADol (ULTRAM) 50 MG tablet Take 50 mg by mouth every 6 (six) hours as needed (pain).     vitamin B-12 (CYANOCOBALAMIN) 1000 MCG tablet Take 1,000 mcg by mouth daily.     vitamin C (ASCORBIC ACID) 500 MG tablet Take 500 mg by mouth daily.     vitamin E 400 UNIT capsule Take 400 Units by mouth daily. 180 mcg     No current facility-administered medications for this visit.    Allergies:   Aspirin and Prilocaine   Social History:  The patient  reports that he quit smoking about 61 years ago. His smoking use included cigarettes. He has a 30.00 pack-year smoking history. He has never used smokeless tobacco. He reports that he does not drink alcohol and does not use drugs.   Family History:  The patient's family history includes Diabetes in his sister; Heart disease in his mother; Hypertension in his brother, daughter, father, sister, and son; Rheum arthritis in his mother and sister; Stroke in his father.   ROS:  Please see the history of present illness.   Otherwise, review of systems is positive for none.   All other systems are reviewed and negative.   PHYSICAL EXAM: VS:  There were no vitals taken for this visit. , BMI There is no height or weight on file to calculate BMI. GEN: Well nourished, well developed, in no acute distress  HEENT: normal  Neck: no JVD, carotid bruits, or masses Cardiac: ***RRR; no murmurs, rubs, or gallops,no edema  Respiratory:  clear to auscultation bilaterally, normal work of breathing GI: soft, nontender, nondistended, + BS MS: no deformity or atrophy  Skin: warm and dry, device site well healed Neuro:  Strength and sensation are intact Psych: euthymic mood, full affect  EKG:  EKG {ACTION; IS/IS POE:[id number]} ordered today. Personal review of the ekg ordered *** shows ***  Personal review of the device interrogation today. Results in [place]: No results found for requested labs within last 8760 hours.     Lipid Panel     Component Value Date/Time   TRIG 98 04/24/2019 0019     Wt Readings from Last 3 Encounters:  09/06/20 159 lb (72.1 kg)  06/01/20 160 lb (72.6 kg)  05/10/20 167 lb 12.8 oz (76.1 kg)      Other studies Reviewed: Additional studies/ records that were reviewed today include: TTE 06/10/17  Review of the above records today demonstrates:  - Left ventricle: The cavity size was normal. Systolic function was   normal. The estimated ejection fraction was in the range of 55%   to 60%. Wall motion was normal; there were no regional wall   motion abnormalities. Features are consistent with a pseudonormal   left ventricular filling pattern, with  concomitant abnormal   relaxation and increased filling pressure (grade 2 diastolic   dysfunction). - Aortic valve: There was mild regurgitation. Valve area (VTI):   1.72 cm^2. Valve area (Vmax): 1.54 cm^2. Valve area (Vmean): 1.74   cm^2. - Left atrium: The atrium was moderately dilated. - Right atrium: The atrium was moderately dilated. - Tricuspid valve: There was moderate regurgitation. - Pulmonary arteries: PA peak pressure: 49 mm Hg (S).   ASSESSMENT AND PLAN:  1.  Sick sinus syndrome: Status post Medtronic dual-chamber pacemaker.  Device functioning appropriately.  Generator change 08/03/2019.  No changes.  2.  SVT: More frequent episodes noted on device interrogation.  Currently on Toprol-XL 50 mg.***   Current medicines are reviewed at length with the patient today.   The patient does not have concerns regarding his medicines.  The following changes were made today: ***  Labs/ tests ordered today include:  No orders of the defined types were placed in this encounter.    Disposition:   FU with Osiris Odriscoll *** months  Signed, Keela Rubert Meredith Leeds, MD  10/10/2021 8:37 AM     [facility] [address] [place] [address] [phone] (office) [phone] (fax)

## 2022-03-01 ENCOUNTER — Encounter: Payer: Medicare HMO | Admitting: Vascular Surgery

## 2022-03-08 ENCOUNTER — Encounter: Payer: Medicare HMO | Admitting: Vascular Surgery

## 2022-03-15 ENCOUNTER — Encounter: Payer: Medicare HMO | Admitting: Vascular Surgery

## 2022-03-22 ENCOUNTER — Encounter: Payer: Medicare HMO | Admitting: Vascular Surgery

## 2022-09-12 ENCOUNTER — Other Ambulatory Visit: Payer: Self-pay | Admitting: *Deleted

## 2022-09-12 MED ORDER — METOPROLOL SUCCINATE ER 50 MG PO TB24: 50.0000 mg | ORAL_TABLET | Freq: Every day | ORAL | 10 refills | Status: AC

## 2022-09-14 ENCOUNTER — Other Ambulatory Visit (HOSPITAL_COMMUNITY): Payer: Self-pay | Admitting: Internal Medicine

## 2022-09-14 DIAGNOSIS — Z992 Dependence on renal dialysis: Secondary | ICD-10-CM

## 2022-09-14 DIAGNOSIS — N186 End stage renal disease: Secondary | ICD-10-CM

## 2022-09-19 ENCOUNTER — Other Ambulatory Visit: Payer: Self-pay | Admitting: Radiology

## 2022-09-20 ENCOUNTER — Encounter (HOSPITAL_COMMUNITY): Payer: Self-pay

## 2022-09-20 ENCOUNTER — Other Ambulatory Visit: Payer: Self-pay

## 2022-09-20 ENCOUNTER — Ambulatory Visit (HOSPITAL_COMMUNITY)
Admission: RE | Admit: 2022-09-20 | Discharge: 2022-09-20 | Disposition: A | Discharge status: Home / Self Care | Payer: Medicare HMO | Source: Ambulatory Visit | Attending: Internal Medicine | Admitting: Internal Medicine

## 2022-09-20 DIAGNOSIS — Z992 Dependence on renal dialysis: Secondary | ICD-10-CM | POA: Diagnosis not present

## 2022-09-20 DIAGNOSIS — I120 Hypertensive chronic kidney disease with stage 5 chronic kidney disease or end stage renal disease: Secondary | ICD-10-CM | POA: Diagnosis not present

## 2022-09-20 DIAGNOSIS — N186 End stage renal disease: Secondary | ICD-10-CM | POA: Diagnosis not present

## 2022-09-20 DIAGNOSIS — Y841 Kidney dialysis as the cause of abnormal reaction of the patient, or of later complication, without mention of misadventure at the time of the procedure: Secondary | ICD-10-CM | POA: Diagnosis not present

## 2022-09-20 DIAGNOSIS — Z87891 Personal history of nicotine dependence: Secondary | ICD-10-CM | POA: Insufficient documentation

## 2022-09-20 DIAGNOSIS — T82898A Other specified complication of vascular prosthetic devices, implants and grafts, initial encounter: Secondary | ICD-10-CM | POA: Insufficient documentation

## 2022-09-20 HISTORY — PX: IR DIALY SHUNT INTRO NEEDLE/INTRACATH INITIAL W/IMG RIGHT: IMG6115

## 2022-09-20 MED ORDER — IOHEXOL 300 MG/ML  SOLN
100.0000 mL | Freq: Once | INTRAMUSCULAR | Status: AC | PRN
  Administered 2022-09-20: 30 mL via INTRAVENOUS

## 2022-09-20 MED ORDER — SODIUM CHLORIDE 0.9 % IV SOLN: INTRAVENOUS | Status: DC

## 2022-09-20 NOTE — H&P (Signed)
Chief Complaint: Patient was seen in consultation today for Right arm dialysis fistula evaluation at the request of Kruska,Lindsay A  Referring Physician(s): Estill Bakes A  Supervising Physician: Simonne Come  Patient Status: [facility] - Out-pt  History of Present Illness: Timothy Carney is a 89 y.o. male   ESRD Has been on dialysis 12 yrs Same access always No issues until recently Has had "vein stretched" once before with Nephrologist Dialysis center notes increased and prolonged bleeding Slow flow Last dialysis yesterday-- complete  Request for evaluation with possible intervention   Past Medical History:  Diagnosis Date   Anemia    Anxiety    Takes Ativan   Benign hypertension 01/06/2016   Blood transfusion 2012   Chronic kidney disease    FOLLOWED BY DR Kathrene Bongo   Chronic kidney disease, stage 5, kidney failure 01/06/2016   managed NEPH 2016: 12 2017: dialysis   Chronic pain syndrome 05/16/2016   2016: onset 2017: opiates   Diverticulitis 2012   Diverticulosis of colon 01/06/2016   2016: diverticulitis   End stage renal disease 07/13/2011   GERD (gastroesophageal reflux disease)    Gout 01/06/2016   Hyperlipidemia, mixed 01/06/2016   Hypertension    takes Metoprolol but pt states it makes blood pressure drop;    Mechanical complication of other vascular device, implant, and graft 09/21/2011   Pacemaker 07/12/10   Dual Chamber Pacemaker ( Dr. Bing Matter at [facility])   Pneumonia April 2012   PSVT (paroxysmal supraventricular tachycardia) 01/06/2016   Secondary hyperparathyroidism    Sick sinus syndrome 01/06/2016   pacemaker    Past Surgical History:  Procedure Laterality Date   AV FISTULA PLACEMENT  07/17/2011   Procedure: ARTERIOVENOUS (AV) FISTULA CREATION;  Surgeon: Sherren Kerns, MD;  Location: [facility];  Service: Vascular;  Laterality: Right;   AV FISTULA PLACEMENT  10/04/2011   Procedure: ARTERIOVENOUS (AV) FISTULA CREATION;  Surgeon: Sherren Kerns, MD;  Location: [facility] OR;  Service: Vascular;;   BUNIONECTOMY     HERNIA REPAIR     INSERT / REPLACE / REMOVE PACEMAKER  07/2010   PPM GENERATOR CHANGEOUT N/A 06/01/2020   Procedure: PPM GENERATOR CHANGEOUT;  Surgeon: Regan Lemming, MD;  [facility];  Service: Cardiovascular;  Laterality: N/A;   PROSTATE SURGERY      Allergies: Aspirin and Prilocaine  Medications: Prior to Admission medications   Medication Sig Start Date End Date Taking? Authorizing Provider  acetaminophen (TYLENOL) 500 MG tablet Take 500 mg by mouth every 6 (six) hours as needed for headache (pain).   Yes [provider]  allopurinol (ZYLOPRIM) 100 MG tablet Take 100 mg by mouth daily. 100 mg every day and 1/2 tablet after dialysis. To prevent Gout   Yes Dough, Doris Cheadle, MD  cinacalcet (SENSIPAR) 30 MG tablet Take 30 mg by mouth at bedtime. Do not take less than 12 hours prior to dialysis 03/27/18  Yes [provider]  diphenhydrAMINE (BENADRYL) 25 MG tablet Take 50 mg by mouth at bedtime as needed for sleep.   Yes [provider]  metoprolol succinate (TOPROL-XL) 50 MG 24 hr tablet Take 1 tablet (50 mg total) by mouth daily. Take with or immediately following a meal. 09/12/22  Yes Camnitz, Will Daphine Deutscher, MD  mometasone (ELOCON) 0.1 % cream Apply 1 application topically daily as needed (itching).   Yes [provider]  Multiple Vitamin (MULTIVITAMIN WITH MINERALS) TABS tablet Take 1 tablet by mouth daily.  Yes [provider]  sevelamer carbonate (RENVELA) 800 MG tablet Take 1,600 mg by mouth 3 (three) times daily with meals.  09/22/16  Yes [provider]  tamsulosin (FLOMAX) 0.4 MG CAPS capsule Take 0.8 mg by mouth daily. 05/10/20  Yes [provider]  traMADol (ULTRAM) 50 MG tablet Take 50 mg by mouth every 6 (six) hours as needed (pain).   Yes [provider]  vitamin B-12 (CYANOCOBALAMIN) 1000 MCG tablet Take 1,000 mcg by  mouth daily.   Yes [provider]  vitamin C (ASCORBIC ACID) 500 MG tablet Take 500 mg by mouth daily.   Yes [provider]  vitamin E 400 UNIT capsule Take 400 Units by mouth daily. 180 mcg   Yes [provider]  ALPRAZolam (XANAX) 1 MG tablet Take 1 mg by mouth at bedtime.    [provider]  aspirin EC 81 MG tablet Take 81 mg by mouth daily.    [provider]  nitroGLYCERIN (NITROSTAT) 0.4 MG SL tablet Place 0.4 mg under the tongue every 5 (five) minutes as needed for chest pain.    [provider]  ondansetron (ZOFRAN) 4 MG tablet Take 1 tablet by mouth every 8 (eight) hours as needed for nausea.  11/28/17   [provider]     Family History  Problem Relation Age of Onset   Heart disease Mother    Rheum arthritis Mother    Hypertension Father    Stroke Father    Hypertension Daughter    Hypertension Son    Rheum arthritis Sister    Diabetes Sister    Hypertension Sister    Hypertension Brother    Anesthesia problems Neg Hx    Hypotension Neg Hx    Malignant hyperthermia Neg Hx    Pseudochol deficiency Neg Hx     Social History   Socioeconomic History   Marital status: Married    Spouse name: Not on file   Number of children: Not on file   Years of education: Not on file   Highest education level: Not on file  Occupational History   Not on file  Tobacco Use   Smoking status: Former    Packs/day: 1.50    Years: 20.00    Additional pack years: 0.00    Total pack years: 30.00    Types: Cigarettes    Quit date: 11/16/1959    Years since quitting: 62.8   Smokeless tobacco: Never  Vaping Use   Vaping Use: Never used  Substance and Sexual Activity   Alcohol use: No   Drug use: No   Sexual activity: Not on file  Other Topics Concern   Not on file  Social History Narrative   Not on file   Social Determinants of Health   Financial Resource Strain: Not on file  Food Insecurity: Not on file   Transportation Needs: Not on file  Physical Activity: Not on file  Stress: Not on file  Social Connections: Not on file    Review of Systems: A 12 point ROS discussed and pertinent positives are indicated in the HPI above.  All other systems are negative.  Review of Systems  Constitutional:  Negative for fatigue and fever.  HENT:  Positive for hearing loss.   Respiratory:  Positive for cough and wheezing.   Cardiovascular:  Negative for chest pain.  Psychiatric/Behavioral:  Negative for behavioral problems and confusion.     Vital Signs: BP (!) 159/79   Pulse  93   Temp 98 F (36.7 C) (Oral)   Resp 18   Ht 5\' 9"  (1.753 m)   Wt 141 lb (64 kg)   SpO2 92%   BMI 20.82 kg/m     Physical Exam Vitals reviewed.  HENT:     Mouth/Throat:     Mouth: Mucous membranes are moist.  Cardiovascular:     Rate and Rhythm: Normal rate and regular rhythm.     Heart sounds: Normal heart sounds.  Pulmonary:     Effort: Pulmonary effort is normal.     Breath sounds: Rhonchi present.  Abdominal:     Tenderness: There is no abdominal tenderness.  Musculoskeletal:        General: Normal range of motion.     Comments: Right arm dialysis fistula: good pulse and thrill ++ aneurysmal  Skin:    General: Skin is warm.  Neurological:     Mental Status: He is alert and oriented to person, place, and time.  Psychiatric:        Behavior: Behavior normal.     Imaging: No results found.  Labs:  CBC: No results for input(s): "WBC", "HGB", "HCT", "PLT" in the last 8760 hours.  COAGS: No results for input(s): "INR", "APTT" in the last 8760 hours.  BMP: No results for input(s): "NA", "K", "CL", "CO2", "GLUCOSE", "BUN", "CALCIUM", "CREATININE", "GFRNONAA", "GFRAA" in the last 8760 hours.  Invalid input(s): "CMP"  LIVER FUNCTION TESTS: No results for input(s): "BILITOT", "AST", "ALT", "ALKPHOS", "PROT", "ALBUMIN" in the last 8760 hours.  TUMOR MARKERS: No results for input(s):  "AFPTM", "CEA", "CA199", "CHROMGRNA" in the last 8760 hours.  Assessment and Plan:  ESRD Rt arm dialysis fistula with prolonged bleeding and slow flow Scheduled for evaluation and possible intervention Possible tunneled dialysis catheter if needed Risks and benefits discussed with the patient including, but not limited to bleeding, infection, vascular injury, pulmonary embolism, need for tunneled HD catheter placement or even death.  All of the patient's questions were answered, patient is agreeable to proceed. Consent signed and in chart.   Thank you for this interesting consult.  I greatly enjoyed meeting Timothy Carney and look forward to participating in their care.  A copy of this report was sent to the requesting provider on this date.  Electronically Signed: Robet Leu, PA-C 09/20/2022, 9:53 AM   I spent a total of  30 Minutes   in face to face in clinical consultation, greater than 50% of which was counseling/coordinating care for Rt arm dialysis access evaluation

## 2022-09-20 NOTE — Procedures (Signed)
Pre-procedure Diagnosis: ESRD Post-procedure Diagnosis: Same  Normally functioning right upper arm AVF without peripheral or central stenosis.   No intervention performed.    Complications: None Immediate EBL: Trace   Katherina Right, MD Pager #: [phone]

## 2023-03-21 ENCOUNTER — Other Ambulatory Visit: Payer: Self-pay | Admitting: Cardiology

## 2023-03-23 ENCOUNTER — Other Ambulatory Visit: Payer: Self-pay | Admitting: Cardiology

## 2023-09-12 ENCOUNTER — Encounter: Admitting: Podiatry

## 2023-09-12 NOTE — Progress Notes (Signed)
Patient did not show for scheduled appointment today.

## 2023-11-08 ENCOUNTER — Encounter: Admitting: Podiatry

## 2023-11-12 NOTE — Progress Notes (Signed)
 Patient was a no-show for his scheduled appointment on November 08, 2023

## 2024-02-22 NOTE — Progress Notes (Signed)
 [facility] Transitional Care Management     Situation   Timothy Carney is a 89 y.o. male who was contacted today for an ED outreach.  Admission Date: 02/21/24   Discharge Date: 02/21/24 (ED visit only)   Institution: [facility]   Diagnosis:  encounter for medical screening examination ( dialysis)  Is this visit eligible for TCM? No (ED visit only)  Background  02/22/24 Initial ED outreach x 1st attempt @ 10:31 am. HN called patient son, Darina, who is listed on hipaa form, # [phone]. Mailbox full and can't leave a message.   02/22/24 Initial ED outreach x 2nd attempt at 2:20 pm. HN called patient's son, Darina, who on hipaa form, # [phone] and mailbox full and can't leave a message.   Since Discharge: HN received notification that patient went to [facility] ED on 02/21/24 for weakness and needing to have dialysis done.  HN read discharge note and patient didn't have transportation to dialysis and EMS was called to take him to the hospital.  He was able to get dialysis there and sent home. Per discharge no medicines changed. HN attempted to outreach patient son, Darina, who is listed on HIPAA form but unable to reach. HN will continue to outreach patient for high utilization monthly.    Primary Care Provider on Record: Lamar LITTIE Hobby, MD   Assessment    General Assessment     None           Recommendation    PCP/specialist notified: Yes  Referral Made: No  Referrals made to other disciplines: None   Future Appointments  Date Time Provider Department Center  04/08/2024 11:30 AM Lamar LITTIE Hobby, MD [facility] [address]    Jon Sharps, RN [place] [facility] [phone]     Electronically signed by: Jon Earnie Sharps, RN 02/22/2024 10:30 AM

## 2024-02-22 NOTE — Progress Notes (Signed)
 Yes, reviewed, does not need to see me in f/u, thanks

## 2024-03-03 DIAGNOSIS — I4581 Long QT syndrome: Secondary | ICD-10-CM | POA: Diagnosis not present

## 2024-03-03 DIAGNOSIS — I4891 Unspecified atrial fibrillation: Secondary | ICD-10-CM | POA: Diagnosis not present

## 2024-03-04 DIAGNOSIS — I350 Nonrheumatic aortic (valve) stenosis: Secondary | ICD-10-CM | POA: Diagnosis not present

## 2024-03-04 DIAGNOSIS — I340 Nonrheumatic mitral (valve) insufficiency: Secondary | ICD-10-CM | POA: Diagnosis not present

## 2024-03-04 DIAGNOSIS — I361 Nonrheumatic tricuspid (valve) insufficiency: Secondary | ICD-10-CM | POA: Diagnosis not present

## 2024-03-07 DIAGNOSIS — R9431 Abnormal electrocardiogram [ECG] [EKG]: Secondary | ICD-10-CM | POA: Diagnosis not present

## 2024-03-07 DIAGNOSIS — I959 Hypotension, unspecified: Secondary | ICD-10-CM | POA: Diagnosis not present

## 2024-03-12 ENCOUNTER — Encounter: Payer: Self-pay | Admitting: Family Medicine

## 2024-04-05 DEATH — deceased
